# Patient Record
Sex: Female | Born: 1990 | Race: Black or African American | Hispanic: No | Marital: Single | Smoking: Current some day smoker
Health system: Southern US, Community
[De-identification: ages and names within clinical notes are randomized; demographics above are authoritative.]

## PROBLEM LIST (undated history)

## (undated) ENCOUNTER — Inpatient Hospital Stay: Payer: Self-pay

## (undated) DIAGNOSIS — K0889 Other specified disorders of teeth and supporting structures: Secondary | ICD-10-CM

## (undated) DIAGNOSIS — D649 Anemia, unspecified: Secondary | ICD-10-CM

## (undated) DIAGNOSIS — Z8742 Personal history of other diseases of the female genital tract: Secondary | ICD-10-CM

## (undated) DIAGNOSIS — Z789 Other specified health status: Secondary | ICD-10-CM

## (undated) DIAGNOSIS — R87629 Unspecified abnormal cytological findings in specimens from vagina: Secondary | ICD-10-CM

## (undated) DIAGNOSIS — J45909 Unspecified asthma, uncomplicated: Secondary | ICD-10-CM

## (undated) HISTORY — PX: NO PAST SURGERIES: SHX2092

## (undated) HISTORY — DX: Unspecified abnormal cytological findings in specimens from vagina: R87.629

## (undated) HISTORY — DX: Personal history of other diseases of the female genital tract: Z87.42

## (undated) HISTORY — DX: Other specified disorders of teeth and supporting structures: K08.89

## (undated) HISTORY — DX: Anemia, unspecified: D64.9

## (undated) HISTORY — DX: Unspecified asthma, uncomplicated: J45.909

---
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
## 2009-09-15 LAB — SICKLE CELL SCREEN: Sickle Cell Screen: NORMAL

## 2009-09-30 ENCOUNTER — Encounter: Payer: Self-pay | Admitting: Obstetrics and Gynecology

## 2009-10-19 ENCOUNTER — Encounter: Payer: Self-pay | Admitting: Pediatric Cardiology

## 2009-10-26 ENCOUNTER — Observation Stay: Payer: Self-pay | Admitting: Obstetrics and Gynecology

## 2009-10-26 ENCOUNTER — Encounter: Payer: Self-pay | Admitting: Pediatric Cardiology

## 2009-12-02 ENCOUNTER — Encounter: Payer: Self-pay | Admitting: Maternal and Fetal Medicine

## 2009-12-09 ENCOUNTER — Encounter: Payer: Self-pay | Admitting: Obstetrics and Gynecology

## 2009-12-16 ENCOUNTER — Encounter: Payer: Self-pay | Admitting: Obstetrics and Gynecology

## 2009-12-21 ENCOUNTER — Encounter: Payer: Self-pay | Admitting: Pediatric Cardiology

## 2009-12-23 ENCOUNTER — Encounter: Payer: Self-pay | Admitting: Maternal & Fetal Medicine

## 2010-01-13 ENCOUNTER — Encounter: Payer: Self-pay | Admitting: Maternal & Fetal Medicine

## 2010-01-31 ENCOUNTER — Inpatient Hospital Stay: Payer: Self-pay | Admitting: Obstetrics and Gynecology

## 2010-01-31 DIAGNOSIS — O365990 Maternal care for other known or suspected poor fetal growth, unspecified trimester, not applicable or unspecified: Secondary | ICD-10-CM

## 2012-04-02 ENCOUNTER — Emergency Department: Payer: Self-pay | Admitting: Emergency Medicine

## 2012-04-02 LAB — URINALYSIS, COMPLETE
Bacteria: NONE SEEN
Bilirubin,UR: NEGATIVE
Blood: NEGATIVE
Ketone: NEGATIVE
Ph: 6 (ref 4.5–8.0)
Specific Gravity: 1.016 (ref 1.003–1.030)
Squamous Epithelial: 1

## 2012-04-02 LAB — COMPREHENSIVE METABOLIC PANEL
Albumin: 3.3 g/dL — ABNORMAL LOW (ref 3.4–5.0)
Anion Gap: 7 (ref 7–16)
BUN: 5 mg/dL — ABNORMAL LOW (ref 7–18)
Calcium, Total: 8.4 mg/dL — ABNORMAL LOW (ref 8.5–10.1)
Chloride: 106 mmol/L (ref 98–107)
Co2: 24 mmol/L (ref 21–32)
EGFR (African American): >60
Glucose: 102 mg/dL — ABNORMAL HIGH (ref 65–99)
Potassium: 3.4 mmol/L — ABNORMAL LOW (ref 3.5–5.1)
SGPT (ALT): 12 U/L (ref 12–78)
Sodium: 137 mmol/L (ref 136–145)
Total Protein: 7.3 g/dL (ref 6.4–8.2)

## 2012-04-02 LAB — CBC
HCT: 34.7 % — ABNORMAL LOW (ref 35.0–47.0)
HGB: 11.8 g/dL — ABNORMAL LOW (ref 12.0–16.0)
MCH: 30.7 pg (ref 26.0–34.0)
MCHC: 33.9 g/dL (ref 32.0–36.0)
RBC: 3.83 10*6/uL (ref 3.80–5.20)
RDW: 13.5 % (ref 11.5–14.5)
WBC: 4.9 10*3/uL (ref 3.6–11.0)

## 2012-04-02 LAB — HCG, QUANTITATIVE, PREGNANCY: Beta Hcg, Quant.: 20266 m[IU]/mL — ABNORMAL HIGH

## 2012-04-22 ENCOUNTER — Encounter: Payer: Self-pay | Admitting: Maternal & Fetal Medicine

## 2012-05-13 ENCOUNTER — Encounter: Payer: Self-pay | Admitting: Maternal & Fetal Medicine

## 2012-07-11 ENCOUNTER — Encounter: Payer: Self-pay | Admitting: Obstetrics & Gynecology

## 2012-07-14 ENCOUNTER — Encounter: Payer: Self-pay | Admitting: Obstetrics & Gynecology

## 2012-07-16 ENCOUNTER — Encounter: Payer: Self-pay | Admitting: Pediatric Cardiology

## 2012-08-02 ENCOUNTER — Observation Stay: Payer: Self-pay | Admitting: Obstetrics and Gynecology

## 2012-08-02 LAB — URINALYSIS, COMPLETE
Bacteria: NONE SEEN
Blood: NEGATIVE
Glucose,UR: NEGATIVE mg/dL (ref 0–75)
Nitrite: NEGATIVE
Ph: 7 (ref 4.5–8.0)
RBC,UR: <1 /HPF (ref 0–5)
Specific Gravity: 1.008 (ref 1.003–1.030)

## 2012-08-02 LAB — DRUG SCREEN, URINE
Amphetamines, Ur Screen: NEGATIVE (ref ?–1000)
Barbiturates, Ur Screen: NEGATIVE (ref ?–200)
Cannabinoid 50 Ng, Ur ~~LOC~~: NEGATIVE (ref ?–50)
Cocaine Metabolite,Ur ~~LOC~~: NEGATIVE (ref ?–300)
MDMA (Ecstasy)Ur Screen: NEGATIVE (ref ?–500)
Opiate, Ur Screen: NEGATIVE (ref ?–300)

## 2012-09-02 ENCOUNTER — Inpatient Hospital Stay: Payer: Self-pay

## 2012-09-02 LAB — CBC WITH DIFFERENTIAL/PLATELET
Eosinophil #: 0.1 10*3/uL (ref 0.0–0.7)
Eosinophil %: 1.1 %
HCT: 31.7 % — ABNORMAL LOW (ref 35.0–47.0)
HGB: 10.6 g/dL — ABNORMAL LOW (ref 12.0–16.0)
Lymphocyte %: 19 %
MCH: 27.9 pg (ref 26.0–34.0)
Monocyte %: 6 %
Platelet: 219 10*3/uL (ref 150–440)
RDW: 14.3 % (ref 11.5–14.5)

## 2012-09-02 LAB — GC/CHLAMYDIA PROBE AMP

## 2012-09-03 LAB — DRUG SCREEN, URINE: Cannabinoid 50 Ng, Ur ~~LOC~~: NEGATIVE (ref ?–50)

## 2013-02-12 DIAGNOSIS — R87612 Low grade squamous intraepithelial lesion on cytologic smear of cervix (LGSIL): Secondary | ICD-10-CM | POA: Insufficient documentation

## 2013-10-23 ENCOUNTER — Encounter: Payer: Self-pay | Admitting: Maternal & Fetal Medicine

## 2013-12-11 ENCOUNTER — Encounter: Payer: Self-pay | Admitting: Maternal & Fetal Medicine

## 2014-01-13 ENCOUNTER — Encounter
Admit: 2014-01-13 | Disposition: A | Discharge status: Home / Self Care | Payer: Self-pay | Admitting: Obstetrics & Gynecology

## 2014-01-13 ENCOUNTER — Encounter
Admit: 2014-01-13 | Disposition: A | Discharge status: Home / Self Care | Payer: Self-pay | Admitting: Pediatric Cardiology

## 2014-02-09 ENCOUNTER — Encounter: Payer: Self-pay | Admitting: Obstetrics and Gynecology

## 2014-02-28 ENCOUNTER — Inpatient Hospital Stay: Payer: Self-pay | Admitting: Obstetrics & Gynecology

## 2014-02-28 LAB — CBC WITH DIFFERENTIAL/PLATELET
BASOS PCT: 0.5 %
Basophil #: 0 10*3/uL (ref 0.0–0.1)
EOS ABS: 0.3 10*3/uL (ref 0.0–0.7)
Eosinophil %: 3.5 %
HCT: 38.6 % (ref 35.0–47.0)
HGB: 13 g/dL (ref 12.0–16.0)
Lymphocyte #: 1.4 10*3/uL (ref 1.0–3.6)
Lymphocyte %: 17.1 %
MCH: 31 pg (ref 26.0–34.0)
MCHC: 33.6 g/dL (ref 32.0–36.0)
MCV: 92 fL (ref 80–100)
Monocyte #: 1 x10 3/mm — ABNORMAL HIGH (ref 0.2–0.9)
Monocyte %: 12.2 %
NEUTROS PCT: 66.7 %
Neutrophil #: 5.4 10*3/uL (ref 1.4–6.5)
Platelet: 186 10*3/uL (ref 150–440)
RBC: 4.19 10*6/uL (ref 3.80–5.20)
RDW: 13.7 % (ref 11.5–14.5)
WBC: 8.1 10*3/uL (ref 3.6–11.0)

## 2014-02-28 LAB — GC/CHLAMYDIA PROBE AMP

## 2014-03-01 LAB — HEMATOCRIT: HCT: 33.2 % — ABNORMAL LOW (ref 35.0–47.0)

## 2014-06-08 LAB — SURGICAL PATHOLOGY

## 2014-06-23 NOTE — H&P (Signed)
L&D Evaluation:  History:  HPI Late entry from 6am  23yo G3P2002 [facility] patient at 1139.2 by LMP c/w US and EDC of 03/05/14 presents with painful contractions and vaginal bleeding and found to be 6cm with a bulging bag.  Pregnancy Issues: 1. Group B positive 2. mulitple STIs this pregnancy - GC and CT, TOC 02/14/14 both negative 3. history of asthma in childhood 4. abnormal PAP -LGSIL- needs colpo pp per report 5. smoker - quit in pregnancy 6. etoh use in pregnancy 7. FOB drinks/uses drugs per report. 8. history of depression 9. G1 complicated by IUGR 10.  There are notes in the patient's [facility] chart referring to a fetal ECHO.  The patient does not know why they had recommended this, and one was not completed.  Per patient, at the anatomy scan she was told that everything was "fine." There is no documentation of necessity of this study.   O+/ Ab neg / VI / RI / HBsAg neg / RPR NR / HIV NR /   Presents with contractions   Patient's Medical History Asthma  in childhood, depression, anemia   Patient's Surgical History none   Medications Pre Natal Vitamins   Allergies NKDA   Social History tobacco  EtOH   Family History depression, HTN, DM, hepatitis   ROS:  ROS All systems were reviewed.  HEENT, CNS, GI, GU, Respiratory, CV, Renal and Musculoskeletal systems were found to be normal.   Exam:  Vital Signs stable   General no apparent distress   Mental Status clear   Chest clear   Heart normal sinus rhythm   Abdomen gravid, non-tender   Estimated Fetal Weight Average for gestational age   Back no CVAT   Reflexes 2+   Clonus negative   Pelvic 6/90/ballotable with bulging bag   Mebranes Intact   FHT normal rate with no decels   Fetal Heart Rate 135   Ucx regular, q2-5 min   Skin dry, no lesions   Impression:  Impression active labor   Plan:  Comments 23yo G3P2002 at 39.2 in labor  1. labor -admit to L&D -GBS positive, start PCN 5million units bolus,  then 2.135million units q4hrs -await SROM as fetus is ballotable -epidural PRN -IVF LR @ 125 -clear diet -GCCT pcr testing on admission  2.Dispo -anticpate SVD with routine recovery -desires Nexplanon PP for contraception -PP care at [facility] -needs colpo PP   Electronic Signatures: Carolyn Petersen, Elenora Fenderhelsea C (MD)  (Signed 16-Jan-16 08:37)  Authored: L&D Evaluation   Last Updated: 16-Jan-16 08:37 by Carolyn Petersen, Elenora Fenderhelsea C (MD)

## 2014-06-23 NOTE — H&P (Signed)
L&D Evaluation:  History Expanded:  HPI 24 yo G2 P1, EDD of 09/10/12 per LMP and 14 week US, presents at 1733w6d with c/o regular contractions. Denies SROM and VB, +FM. Pt monitored for several hours with intermittant ambulation, cervix changed from 4 to 5 cm. PNC at [facility] notable for early entry to care, early MJ, tobacco and ETOH use, anemia, h/o DV, + Chlamydia tx 08/21/12 - no TOC yet. Prior SVD 01/31/10- IUGR (5#8oz)   Blood Type (Maternal) O positive   Group B Strep Results Maternal (Result >5wks must be treated as unknown) positive   Maternal HIV Negative   Maternal Syphilis Ab Nonreactive   Maternal Varicella Immune   Rubella Results (Maternal) immune   Maternal T-Dap Immune   Patient's Medical History Asthma   Patient's Surgical History none   Medications Pre Natal Vitamins   Allergies NKDA   Social History prior use tobacco, MJ   ROS:  ROS see HPI   Exam:  Vital Signs stable   General no apparent distress   Mental Status clear   Chest clear   Heart no murmur/gallop/rubs   Abdomen gravid, tender with contractions   Estimated Fetal Weight Small for gestational age, 6 lbs   Fetal Position vertex   Pelvic no external lesions, 5/75/-2   Mebranes Intact   FHT normal rate with no decels, category 1 tracing   Ucx regular   Impression:  Impression active labor   Plan:  Plan antibiotics for GBBS prophylaxis   Comments Pt desires to ambulate again, will allow ambulation after IV/labs drawn/antibiotics started. May have pain medication or epidural when desired.  UDS Urine Aptima   Electronic Signatures: Vella KohlerBrothers, Lydian Chavous K (CNM)  (Signed 21-Jul-14 20:15)  Authored: L&D Evaluation   Last Updated: 21-Jul-14 20:15 by Vella KohlerBrothers, Melesa Lecy K (CNM)

## 2015-02-14 NOTE — L&D Delivery Note (Signed)
Delivery Note At 3:31 AM a viable and female  was delivered via Vaginal, Spontaneous Delivery (Presentation: ;  ).  APGAR8/9: , ; weight  .   Placenta status intact: , .  Cord: 3 v delayed clamping   with the following complications:None .  Cord pH: not done  Uncomplicated SVD . AROM at +3 station . Vigorous female  Anesthesia:CLE   Episiotomy: none  Lacerations: none   Suture Repair: n/a Est. Blood Loss (mL):  200 cc  Mom to postpartum.  Baby to Couplet care / Skin to Skin.  Marian Meneely 12/01/2015, 3:41 AM

## 2015-06-23 ENCOUNTER — Encounter: Payer: Self-pay | Admitting: *Deleted

## 2015-06-23 ENCOUNTER — Emergency Department
Admission: EM | Admit: 2015-06-23 | Discharge: 2015-06-23 | Disposition: A | Discharge status: Home / Self Care | Payer: Medicaid Other | Attending: Emergency Medicine | Admitting: Emergency Medicine

## 2015-06-23 ENCOUNTER — Emergency Department: Payer: Medicaid Other

## 2015-06-23 DIAGNOSIS — R109 Unspecified abdominal pain: Secondary | ICD-10-CM

## 2015-06-23 DIAGNOSIS — O26892 Other specified pregnancy related conditions, second trimester: Secondary | ICD-10-CM | POA: Insufficient documentation

## 2015-06-23 DIAGNOSIS — M545 Low back pain: Secondary | ICD-10-CM | POA: Diagnosis present

## 2015-06-23 DIAGNOSIS — Z3A16 16 weeks gestation of pregnancy: Secondary | ICD-10-CM | POA: Insufficient documentation

## 2015-06-23 DIAGNOSIS — O26899 Other specified pregnancy related conditions, unspecified trimester: Secondary | ICD-10-CM

## 2015-06-23 DIAGNOSIS — R1030 Lower abdominal pain, unspecified: Secondary | ICD-10-CM | POA: Insufficient documentation

## 2015-06-23 LAB — COMPREHENSIVE METABOLIC PANEL
ALT: 9 U/L — AB (ref 14–54)
AST: 16 U/L (ref 15–41)
Albumin: 4.1 g/dL (ref 3.5–5.0)
Alkaline Phosphatase: 56 U/L (ref 38–126)
Anion gap: 7 (ref 5–15)
CHLORIDE: 105 mmol/L (ref 101–111)
CO2: 23 mmol/L (ref 22–32)
Calcium: 9.1 mg/dL (ref 8.9–10.3)
Creatinine, Ser: 0.56 mg/dL (ref 0.44–1.00)
GFR calc Af Amer: >60 mL/min (ref >60)
GFR calc non Af Amer: >60 mL/min (ref >60)
GLUCOSE: 86 mg/dL (ref 65–99)
POTASSIUM: 3.5 mmol/L (ref 3.5–5.1)
Sodium: 135 mmol/L (ref 135–145)
Total Bilirubin: 0.3 mg/dL (ref 0.3–1.2)
Total Protein: 7.4 g/dL (ref 6.5–8.1)

## 2015-06-23 LAB — CBC
HEMATOCRIT: 38.1 % (ref 35.0–47.0)
Hemoglobin: 12.9 g/dL (ref 12.0–16.0)
MCH: 30.7 pg (ref 26.0–34.0)
MCHC: 34 g/dL (ref 32.0–36.0)
MCV: 90.4 fL (ref 80.0–100.0)
Platelets: 218 10*3/uL (ref 150–440)
RBC: 4.22 MIL/uL (ref 3.80–5.20)
RDW: 13.7 % (ref 11.5–14.5)
WBC: 6.3 10*3/uL (ref 3.6–11.0)

## 2015-06-23 LAB — URINALYSIS COMPLETE WITH MICROSCOPIC (ARMC ONLY)
BACTERIA UA: NONE SEEN
BILIRUBIN URINE: NEGATIVE
GLUCOSE, UA: NEGATIVE mg/dL
Hgb urine dipstick: NEGATIVE
Ketones, ur: NEGATIVE mg/dL
LEUKOCYTES UA: NEGATIVE
Nitrite: NEGATIVE
PH: 7 (ref 5.0–8.0)
Protein, ur: NEGATIVE mg/dL
Specific Gravity, Urine: 1.003 — ABNORMAL LOW (ref 1.005–1.030)

## 2015-06-23 LAB — HCG, QUANTITATIVE, PREGNANCY: hCG, Beta Chain, Quant, S: 11688 m[IU]/mL — ABNORMAL HIGH (ref <5)

## 2015-06-23 MED ORDER — CYCLOBENZAPRINE HCL 10 MG PO TABS: 10.0000 mg | ORAL_TABLET | Freq: Three times a day (TID) | ORAL | Status: DC | PRN

## 2015-06-23 NOTE — Discharge Instructions (Signed)

## 2015-06-23 NOTE — ED Notes (Signed)
Pt provided water to drink, ok per Dr. Mayford KnifeWilliams.

## 2015-06-23 NOTE — ED Provider Notes (Signed)
[facility] Emergency Department Provider Note        Time seen: ----------------------------------------- 7:48 PM on 06/23/2015 -----------------------------------------    I have reviewed the triage vital signs and the nursing notes.   HISTORY  Chief Complaint Abdominal Pain    HPI Carolyn Petersen is a 25 y.o. female who states she is around [redacted] weeks pregnant and is having lower abdominal pain and low back pain. Symptoms began yesterday. She is G4 P3, no vaginal bleeding or discharge. States she has some diarrhea that started this morning. [facility] Nothing makes her pain better or worse.   No past medical history on file.  There are no active problems to display for this patient.   No past surgical history on file.  Allergies Review of patient's allergies indicates no known allergies.  Social History Social History  Substance Use Topics  . Smoking status: Never Smoker   . Smokeless tobacco: None  . Alcohol Use: No    Review of Systems Constitutional: Negative for fever. Eyes: Negative for visual changes. ENT: Negative for sore throat. Cardiovascular: Negative for chest pain. Respiratory: Negative for shortness of breath. Gastrointestinal: Positive for abdominal pain Genitourinary: Negative for dysuria. Musculoskeletal: Positive for low back pain Skin: Negative for rash. Neurological: Negative for headaches, focal weakness or numbness.  10-point ROS otherwise negative.  ____________________________________________   PHYSICAL EXAM:  VITAL SIGNS: ED Triage Vitals  Enc Vitals Group     BP 06/23/15 1709 131/82 mmHg     Pulse Rate 06/23/15 1709 77     Resp 06/23/15 1709 20     Temp 06/23/15 1709 98.6 F (37 C)     Temp Source 06/23/15 1709 Oral     SpO2 06/23/15 1709 100 %     Weight 06/23/15 1709 137 lb (62.143 kg)     Height 06/23/15 1709 5\' 2"  (1.575 m)     Head Cir --      Peak Flow --      Pain Score 06/23/15 1710 6     Pain Loc --      Pain Edu? --      Excl. in GC? --     Constitutional: Alert and oriented. Well appearing and in no distress. Eyes: Conjunctivae are normal. PERRL. Normal extraocular movements. ENT   Head: Normocephalic and atraumatic.   Nose: No congestion/rhinnorhea.   Mouth/Throat: Mucous membranes are moist.   Neck: No stridor. Cardiovascular: Normal rate, regular rhythm. No murmurs, rubs, or gallops. Respiratory: Normal respiratory effort without tachypnea nor retractions. Breath sounds are clear and equal bilaterally. No wheezes/rales/rhonchi. Gastrointestinal: Gravid uterus, nonfocal tenderness. Normal bowel sounds. Musculoskeletal: Nontender with normal range of motion in all extremities. No lower extremity tenderness nor edema. Neurologic:  Normal speech and language. No gross focal neurologic deficits are appreciated.  Skin:  Skin is warm, dry and intact. No rash noted. Psychiatric: Mood and affect are normal. Speech and behavior are normal.  ____________________________________________  ED COURSE:  Pertinent labs & imaging results that were available during my care of the patient were reviewed by me and considered in my medical decision making (see chart for details). Patient with diffuse abdominal pain and pregnancy, will check basic labs and imaging. ____________________________________________    LABS (pertinent positives/negatives)  Labs Reviewed  COMPREHENSIVE METABOLIC PANEL - Abnormal; Notable for the following:    BUN <5 (*)    ALT 9 (*)    All other components within normal limits  URINALYSIS COMPLETEWITH  MICROSCOPIC ([facility] ONLY) - Abnormal; Notable for the following:    Color, Urine STRAW (*)    APPearance CLEAR (*)    Specific Gravity, Urine 1.003 (*)    Squamous Epithelial / LPF 6-30 (*)    All other components within normal limits  HCG, QUANTITATIVE, PREGNANCY - Abnormal; Notable for the following:    hCG,  Beta Chain, Quant, [place] [address] (*)    All other components within normal limits  CBC    RADIOLOGY  Pregnancy ultrasound IMPRESSION: 1. Single intrauterine fetus with normal heart rate and subjectively normal amniotic fluid volume. 2. Estimated gestational age [redacted] weeks 1 day  This exam is performed on an emergent basis and does not comprehensively evaluate fetal size, dating, or anatomy; follow-up complete OB US should be considered if further fetal assessment is warranted.  ____________________________________________  FINAL ASSESSMENT AND PLAN  Abdominal pain and pregnancy  Plan: Patient with labs and imaging as dictated above. No clear etiology for her symptoms. I will advise Tylenol and Flexeril to take as needed. She stable for outpatient follow-up with her GYN doctor.   Emily Filbert, MD   Note: This dictation was prepared with Dragon dictation. Any transcriptional errors that result from this process are unintentional   Emily Filbert, MD 06/23/15 2022

## 2015-06-23 NOTE — ED Notes (Signed)
Pt is approx  [redacted] weeks pregnant.  Pt now has low abd pain.  Pt reports low back pain   No vag bleeding  No dysuria.  Pt alert.

## 2015-06-23 NOTE — ED Notes (Addendum)
Pt states lower back pain, lower abdominal pain that began yesterday. Pt states she had 1 US when she first found out she was pregnant, states she is about 4 months along. Denies vaginal bleeding or discharge. Denies fever, N&V. States some diarrhea that began this morning. Pt sees [facility] for OB care.

## 2015-09-02 ENCOUNTER — Observation Stay
Admission: EM | Admit: 2015-09-02 | Discharge: 2015-09-02 | Disposition: A | Discharge status: Home / Self Care | Payer: Medicaid Other | Attending: Obstetrics and Gynecology | Admitting: Obstetrics and Gynecology

## 2015-09-02 DIAGNOSIS — O9989 Other specified diseases and conditions complicating pregnancy, childbirth and the puerperium: Secondary | ICD-10-CM | POA: Diagnosis not present

## 2015-09-02 DIAGNOSIS — O99342 Other mental disorders complicating pregnancy, second trimester: Secondary | ICD-10-CM | POA: Insufficient documentation

## 2015-09-02 DIAGNOSIS — R1030 Lower abdominal pain, unspecified: Secondary | ICD-10-CM | POA: Diagnosis not present

## 2015-09-02 DIAGNOSIS — F329 Major depressive disorder, single episode, unspecified: Secondary | ICD-10-CM | POA: Insufficient documentation

## 2015-09-02 DIAGNOSIS — Z3A26 26 weeks gestation of pregnancy: Secondary | ICD-10-CM | POA: Diagnosis not present

## 2015-09-02 LAB — URINALYSIS COMPLETE WITH MICROSCOPIC (ARMC ONLY)
Bilirubin Urine: NEGATIVE
Glucose, UA: NEGATIVE mg/dL
HGB URINE DIPSTICK: NEGATIVE
Ketones, ur: NEGATIVE mg/dL
LEUKOCYTES UA: NEGATIVE
NITRITE: NEGATIVE
PH: 6 (ref 5.0–8.0)
PROTEIN: NEGATIVE mg/dL
SPECIFIC GRAVITY, URINE: 1.011 (ref 1.005–1.030)

## 2015-09-02 LAB — FETAL FIBRONECTIN: FETAL FIBRONECTIN: NEGATIVE

## 2015-09-02 MED ORDER — TERBUTALINE SULFATE 1 MG/ML IJ SOLN
INTRAMUSCULAR | Status: AC
  Administered 2015-09-02: 0.25 mg via SUBCUTANEOUS
  Filled 2015-09-02: qty 1

## 2015-09-02 MED ORDER — TERBUTALINE SULFATE 1 MG/ML IJ SOLN
0.2500 mg | Freq: Once | INTRAMUSCULAR | Status: AC
  Administered 2015-09-02: 0.25 mg via SUBCUTANEOUS

## 2015-09-02 NOTE — OB Triage Note (Signed)
Pt discharging walking to car with FOB and child with belongings and written instructions. Instructions reviewed with pt, pt voicing understanding with no further questions

## 2015-09-02 NOTE — OB Triage Note (Signed)
[name] here with lower abdominal pain that began "late last night", cannot describe how often pain occurs, rates pain 6/10, and states the pain lasts about 3 min when it occurs. Last episode of pain approx 10 min ago . Has not contacted [facility] regarding this pain. Denies bleeding, LOF, denies any S/S of urinary discomfort.  Reports +FM. Patient given marker button to press when she feels pain.

## 2015-09-02 NOTE — Discharge Summary (Signed)
  Author: Suzy Bouchardhomas J Schermerhorn, MD Service: Obstetrics Author Type: Physician    Filed: 09/02/2015 8:09 PM Note Time: 09/02/2015 8:01 PM Status: Signed   Editor: Suzy Bouchardhomas J Schermerhorn, MD (Physician)     Expand All Collapse All   Patient ID: Carolyn Petersen Meikle, female DOB: 10-29-1990, 25 y.o. MRN: [id number] Carolyn Petersen Pollino 10-29-1990 G4 P3 8446w3d presents for Lower adb pain left > right since 12 am this am . Pain come on after sexual intercourse . Some nausea . No emesis Or fever . BM are nl .  noLOF , no vaginal bleeding , PMHX: dysplasia  Depression  PSHX : none POBX 3 SVD alergies NKDA  Social no tobacco , etoh , no illicit drugs  O;Temp(Src) 97.9 F (36.6 C) (Oral)  Resp 20  Ht 5\' 2"  (1.575 m)  Wt 138 lb (62.596 kg)  BMI 25.23 kg/m2  LMP 03/01/2015 (Approximate) ABDsoft , nondistended . No rebound   CX int os closed by RN NSTreactive , no decels Rare CTX initially --> dissipated after sq terb  Labs: ua neg A: Abdominal pain with benign exam . Pain may be related to sexual activity and ctx . No PTL by exam  Reassuring fetal monitoring P:d/c hom ewith precautions  Pelvic rest  F/up [facility] / L+D if symptoms persist or worsen  Cont daily Fetal movt

## 2015-09-02 NOTE — Progress Notes (Signed)
Patient ID: Carolyn Petersen, female   DOB: 01/04/1991, 25 y.o.   MRN: [id number] Carolyn Petersen 01/04/1991 G4 P3 968w3d presents for  Lower adb pain left > right since 12 am this am . Pain come on after sexual intercourse . Some nausea . No emesis  Or fever . BM are nl .  noLOF , no vaginal bleeding , PMHX: dysplasia  Depression  PSHX : none POBX 3 SVD alergies NKDA  Social no tobacco , etoh , no illicit drugs  O;Temp(Src) 97.9 F (36.6 C) (Oral)  Resp 20  Ht 5\' 2"  (1.575 m)  Wt 138 lb (62.596 kg)  BMI 25.23 kg/m2  LMP 03/01/2015 (Approximate) ABDsoft , nondistended . No rebound   CX int os closed by RN NSTreactive , no decels  Rare CTX initially --> dissipated after sq terb  Labs: ua neg A: Abdominal pain with benign exam . Pain may be related to sexual activity and ctx . No PTL by exam  Reassuring fetal monitoring P:d/c hom ewith precautions  Pelvic rest  F/up [facility] / L+D if symptoms persist or worsen  Cont daily Fetal movt

## 2015-09-02 NOTE — Discharge Instructions (Signed)
Note pt instructed as per Dr Feliberto GottronSchermerhorn- to [facility] if further concerns develop. Also to keep her next Health Dept appointment on the 31st. Pt also told to refrain from sexual activity, and breast stimulation.

## 2015-09-03 LAB — URINE CULTURE: Culture: 9000 — AB

## 2015-10-14 ENCOUNTER — Emergency Department
Admission: EM | Admit: 2015-10-14 | Discharge: 2015-10-14 | Disposition: A | Discharge status: Home / Self Care | Payer: Medicaid Other | Attending: Emergency Medicine | Admitting: Emergency Medicine

## 2015-10-14 ENCOUNTER — Encounter: Payer: Self-pay | Admitting: Emergency Medicine

## 2015-10-14 ENCOUNTER — Emergency Department: Payer: Medicaid Other

## 2015-10-14 DIAGNOSIS — R0789 Other chest pain: Secondary | ICD-10-CM | POA: Insufficient documentation

## 2015-10-14 DIAGNOSIS — K219 Gastro-esophageal reflux disease without esophagitis: Secondary | ICD-10-CM | POA: Diagnosis not present

## 2015-10-14 DIAGNOSIS — R0602 Shortness of breath: Secondary | ICD-10-CM | POA: Insufficient documentation

## 2015-10-14 DIAGNOSIS — O99613 Diseases of the digestive system complicating pregnancy, third trimester: Secondary | ICD-10-CM | POA: Diagnosis not present

## 2015-10-14 DIAGNOSIS — Z3A32 32 weeks gestation of pregnancy: Secondary | ICD-10-CM | POA: Diagnosis not present

## 2015-10-14 DIAGNOSIS — O99513 Diseases of the respiratory system complicating pregnancy, third trimester: Secondary | ICD-10-CM | POA: Insufficient documentation

## 2015-10-14 DIAGNOSIS — K2960 Other gastritis without bleeding: Secondary | ICD-10-CM

## 2015-10-14 LAB — URINALYSIS COMPLETE WITH MICROSCOPIC (ARMC ONLY)
BACTERIA UA: NONE SEEN
Bilirubin Urine: NEGATIVE
Glucose, UA: NEGATIVE mg/dL
HGB URINE DIPSTICK: NEGATIVE
Ketones, ur: NEGATIVE mg/dL
LEUKOCYTES UA: NEGATIVE
NITRITE: NEGATIVE
PH: 6 (ref 5.0–8.0)
PROTEIN: NEGATIVE mg/dL
RBC / HPF: NONE SEEN RBC/hpf (ref 0–5)
Specific Gravity, Urine: 1.009 (ref 1.005–1.030)

## 2015-10-14 LAB — COMPREHENSIVE METABOLIC PANEL
ALBUMIN: 3.2 g/dL — AB (ref 3.5–5.0)
ALT: 9 U/L — ABNORMAL LOW (ref 14–54)
ANION GAP: 6 (ref 5–15)
AST: 22 U/L (ref 15–41)
Alkaline Phosphatase: 54 U/L (ref 38–126)
BUN: 6 mg/dL (ref 6–20)
CHLORIDE: 108 mmol/L (ref 101–111)
CO2: 21 mmol/L — AB (ref 22–32)
Calcium: 8.3 mg/dL — ABNORMAL LOW (ref 8.9–10.3)
Creatinine, Ser: 0.65 mg/dL (ref 0.44–1.00)
GFR calc Af Amer: >60 mL/min (ref >60)
GFR calc non Af Amer: >60 mL/min (ref >60)
GLUCOSE: 83 mg/dL (ref 65–99)
POTASSIUM: 3.1 mmol/L — AB (ref 3.5–5.1)
SODIUM: 135 mmol/L (ref 135–145)
Total Bilirubin: 0.4 mg/dL (ref 0.3–1.2)
Total Protein: 6.4 g/dL — ABNORMAL LOW (ref 6.5–8.1)

## 2015-10-14 LAB — CBC WITH DIFFERENTIAL/PLATELET
BASOS PCT: 1 %
Basophils Absolute: 0 10*3/uL (ref 0–0.1)
EOS ABS: 0.5 10*3/uL (ref 0–0.7)
EOS PCT: 8 %
HCT: 33.4 % — ABNORMAL LOW (ref 35.0–47.0)
HEMOGLOBIN: 12 g/dL (ref 12.0–16.0)
Lymphocytes Relative: 28 %
Lymphs Abs: 1.8 10*3/uL (ref 1.0–3.6)
MCH: 32.2 pg (ref 26.0–34.0)
MCHC: 36 g/dL (ref 32.0–36.0)
MCV: 89.5 fL (ref 80.0–100.0)
MONO ABS: 0.6 10*3/uL (ref 0.2–0.9)
MONOS PCT: 10 %
NEUTROS PCT: 55 %
Neutro Abs: 3.6 10*3/uL (ref 1.4–6.5)
PLATELETS: 162 10*3/uL (ref 150–440)
RBC: 3.73 MIL/uL — ABNORMAL LOW (ref 3.80–5.20)
RDW: 13.5 % (ref 11.5–14.5)
WBC: 6.5 10*3/uL (ref 3.6–11.0)

## 2015-10-14 LAB — FIBRIN DERIVATIVES D-DIMER (ARMC ONLY): Fibrin derivatives D-dimer (ARMC): 743 — ABNORMAL HIGH (ref 0–499)

## 2015-10-14 MED ORDER — RANITIDINE HCL 150 MG PO TABS: 150.0000 mg | ORAL_TABLET | Freq: Every day | ORAL | 1 refills | Status: DC

## 2015-10-14 MED ORDER — IOPAMIDOL (ISOVUE-370) INJECTION 76%
100.0000 mL | Freq: Once | INTRAVENOUS | Status: AC | PRN
  Administered 2015-10-14: 100 mL via INTRAVENOUS

## 2015-10-14 NOTE — ED Provider Notes (Signed)
Time Seen: Approximately 0428  I have reviewed the triage notes  Chief Complaint: Chest Pain and Shortness of Breath   History of Present Illness: Carolyn Petersen is a 25 y.o. female who states that she's had some occasional left-sided chest discomfort and shortness of breath. Patient states that she spit up some bloody sputum this evening. She states she's had this now for the past week. Seemed to be worse today. Denies any melena or hematochezia. She is currently [redacted] weeks pregnant gravida 4 para 3 with no previous complications with her deliveries. She denies any leg pain or swelling or history of pulmonary embolism or deep venous thrombosis. Denies any current chest or abdominal pain.   History reviewed. No pertinent past medical history.  Patient Active Problem List   Diagnosis Date Noted  . Labor and delivery, indication for care 09/02/2015    History reviewed. No pertinent surgical history.  History reviewed. No pertinent surgical history.  Current Outpatient Rx  . Order #: [id number] Class: Print    Allergies:  Review of patient's allergies indicates no known allergies.  Family History: History reviewed. No pertinent family history.  Social History: Social History  Substance Use Topics  . Smoking status: Never Smoker  . Smokeless tobacco: Never Used  . Alcohol use No     Review of Systems:   10 point review of systems was performed and was otherwise negative:  Constitutional: No fever Eyes: No visual disturbances ENT: She states she's had occasional sore throat without any ear pain Cardiac: No current chest pain Respiratory: Mild shortness of breath, wheezing, or stridor Abdomen: No abdominal pain, no vomiting, No diarrhea Endocrine: No weight loss, No night sweats Extremities: No peripheral edema, cyanosis Skin: No rashes, easy bruising Neurologic: No focal weakness, trouble with speech or swollowing Urologic: No dysuria, Hematuria, or urinary  frequency   Physical Exam:  ED Triage Vitals  Enc Vitals Group     BP 10/14/15 0434 117/70     Pulse Rate 10/14/15 0432 72     Resp 10/14/15 0432 12     Temp 10/14/15 0432 98.2 F (36.8 C)     Temp Source 10/14/15 0432 Oral     SpO2 10/14/15 0428 100 %     Weight 10/14/15 0435 148 lb (67.1 kg)     Height 10/14/15 0435 5\' 2"  (1.575 m)     Head Circumference --      Peak Flow --      Pain Score 10/14/15 0434 5     Pain Loc --      Pain Edu? --      Excl. in GC? --     General: Awake , Alert , and Oriented times 3; GCS 15 Head: Normal cephalic , atraumatic Eyes: Pupils equal , round, reactive to light Nose/Throat: No nasal drainage, patent upper airway without erythema or exudate. No visible bleeding in the nasal or oral cavity Neck: Supple, Full range of motion, No anterior adenopathy or palpable thyroid masses Lungs: Clear to ascultation without wheezes , rhonchi, or rales Heart: Regular rate, regular rhythm without murmurs , gallops , or rubs Abdomen: Thayer OhmLeopold maneuvers show palpable uterus above the umbilicus approximately 10 cm, no rebound, guarding , or rigidity; bowel sounds positive and symmetric in all 4 quadrants. No organomegaly .        Extremities: 2 plus symmetric pulses. No edema, clubbing or cyanosis Neurologic: normal ambulation, Motor symmetric without deficits, sensory intact Skin: warm, dry, no rashes   Labs:  All laboratory work was reviewed including any pertinent negatives or positives listed below:  Labs Reviewed  CBC WITH DIFFERENTIAL/PLATELET  COMPREHENSIVE METABOLIC PANEL  URINALYSIS COMPLETEWITH MICROSCOPIC ([facility] ONLY)  FIBRIN DERIVATIVES D-DIMER ([facility] ONLY)  Review laboratory work shows an elevated D-dimer test   Radiology:  "Ct Angio Chest Pe W Or Wo Contrast  Result Date: 10/14/2015 CLINICAL DATA:  Left-sided chest pain, shortness of breath, bloody sputum. Thirty-two weeks pregnant. EXAM: CT ANGIOGRAPHY CHEST WITH CONTRAST TECHNIQUE:  Multidetector CT imaging of the chest was performed using the standard protocol during bolus administration of intravenous contrast. Multiplanar CT image reconstructions and MIPs were obtained to evaluate the vascular anatomy. CONTRAST:  100 mL Isovue 370 IV COMPARISON:  Chest CT 06/30/2007 FINDINGS: Vascular: No pulmonary embolus. The main pulmonary artery is within normal limits for size. No CT evidence of acute right heart strain. Visualized portion of the aorta is normal. Heart size is normal. Mediastinum/Nodes: No mediastinal or axillary adenopathy. Lungs: No pulmonary nodules or masses. No pleural effusion or focal consolidation. Visualized abdomen: Contrast bolus timing is not optimized for evaluation of the abdominal organs. Within this limitation, the visualized organs of the upper abdomen are normal. Musculoskeletal: No lytic or blastic osseous lesions. The visualized extrathoracic soft tissues are normal. Review of the MIP images confirms the above findings. IMPRESSION: 1. No pulmonary embolus. 2. Normal chest CT. Electronically Signed   By: Deatra Robinson M.D.   On: 10/14/2015 06:23  " * I personally reviewed the radiologic studies     ED Course: * Given the patient's current clinical presentation and objective findings appears that her blood tinged sputum was unlikely to be from a pulmonary source and I felt it may be esophageal reflux disease given that she's in the third trimester of her pregnancy. She does feel fetal movements and fetal heart tones were performed which were normal. He does not appear to have any pulmonary embolism, community-acquired pneumonia, pregnancy related pulmonary edema, etc. CT was performed after we discussed risk factors and alternatives etc. She agreed to the CAT scan after discussion with the radiology tech at the bedside about the radiation exposure and risk.   Clinical Course     Assessment:  Hemoptysis Possible esophageal reflux  disease      Plan:  Outpatient " New Prescriptions   RANITIDINE (ZANTAC) 150 MG TABLET    Take 1 tablet (150 mg total) by mouth at bedtime.  " Patient was advised to return immediately if condition worsens. Patient was advised to follow up with their primary care physician or other specialized physicians involved in their outpatient care. The patient and/or family member/power of attorney had laboratory results reviewed at the bedside. All questions and concerns were addressed and appropriate discharge instructions were distributed by the nursing staff.             Jennye Moccasin, MD 10/14/15 [phone]

## 2015-10-14 NOTE — ED Triage Notes (Signed)
Pt arrived from home by EMS with c/o left sided chest pain, SOB and bloody sputum without cough, x1 week. Pt is [redacted] week pregnant, denies abdominal pain.

## 2015-11-19 ENCOUNTER — Observation Stay
Admission: EM | Admit: 2015-11-19 | Discharge: 2015-11-19 | Disposition: A | Discharge status: Home / Self Care | Payer: Medicaid Other | Attending: Obstetrics and Gynecology | Admitting: Obstetrics and Gynecology

## 2015-11-19 DIAGNOSIS — O365990 Maternal care for other known or suspected poor fetal growth, unspecified trimester, not applicable or unspecified: Secondary | ICD-10-CM | POA: Diagnosis present

## 2015-11-19 DIAGNOSIS — Z3A37 37 weeks gestation of pregnancy: Secondary | ICD-10-CM | POA: Insufficient documentation

## 2015-11-19 DIAGNOSIS — O288 Other abnormal findings on antenatal screening of mother: Secondary | ICD-10-CM | POA: Diagnosis present

## 2015-11-19 DIAGNOSIS — O365930 Maternal care for other known or suspected poor fetal growth, third trimester, not applicable or unspecified: Principal | ICD-10-CM | POA: Insufficient documentation

## 2015-11-19 MED ORDER — ACETAMINOPHEN 325 MG PO TABS: 650.0000 mg | ORAL_TABLET | ORAL | Status: DC | PRN

## 2015-11-19 NOTE — Progress Notes (Signed)
Dr schermerhorn here to visit patient and discuss plan of care with pt.

## 2015-11-19 NOTE — Plan of Care (Signed)
Pt d/c home with d/c instructions after being seen by dr schermerhorn and monitor strip reviewed. Pt to come to [facility] for biweekly nst until she delivers.

## 2015-11-19 NOTE — Discharge Summary (Signed)
  Carolyn CromerJalesia Petersen 1990-12-22 G4 P3 2252w4d presents for low birth weight on U/S today at [facility] . EFW 17%, normal AfInoLOF , no vaginal bleeding , PMHX : depression LGSIL , asthma  POBx low birth weight infant LMP 03/01/2015 (Approximate)   O; Lungs CTA CV RRR  ABDsoft NT  CX 1cm / 30 OOP  NSTreactive , regular ctx  Labs: none A: ctx and no labor  SGA with normal NST  P: daily kick counts  [facility] has been notified  2x/ week NST

## 2015-11-19 NOTE — Plan of Care (Signed)
Pt states she was over at the dr's office and received an ultrasound that she states they said the baby was small for gestational age. Then was sent to l/d for NST. EFM applied and instructions given to pt.

## 2015-11-24 ENCOUNTER — Inpatient Hospital Stay
Admission: RE | Admit: 2015-11-24 | Discharge: 2015-11-24 | Disposition: A | Discharge status: Home / Self Care | Payer: Medicaid Other | Attending: Obstetrics and Gynecology | Admitting: Obstetrics and Gynecology

## 2015-11-24 DIAGNOSIS — Z3483 Encounter for supervision of other normal pregnancy, third trimester: Secondary | ICD-10-CM | POA: Insufficient documentation

## 2015-11-24 DIAGNOSIS — Z3A38 38 weeks gestation of pregnancy: Secondary | ICD-10-CM | POA: Insufficient documentation

## 2015-11-24 NOTE — Progress Notes (Signed)
1. Admit for Observation to [facility]. 2. Non-stress test 3. Call MD/CNM with results. 4. For non-reassuring strip, use resuscitative measures (turning, O2, start IV and call on call STAT).

## 2015-11-24 NOTE — H&P (Signed)
ANTEPARTUM ADMISSION HISTORY AND PHYSICAL NOTE   History of Present Illness: Carolyn Petersen is a [age] y.o. G4P3003 at 4371w2d for NST today at [facility]. LMP was 03/01/15 with EDD of 12/06/15. PNC at [facility] significant for Past hx of low birth weight in previous infant.    Patient Active Problem List   Diagnosis Date Noted  . NST (non-stress test) nonreactive 11/19/2015  . Pregnancy affected by fetal growth restriction 11/19/2015  . 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  OB History  Gravida Para Term Preterm AB Living  4 3 3     3   SAB TAB Ectopic Multiple Live Births          3    # Outcome Date GA Lbr Len/2nd Weight Sex Delivery Anes PTL Lv  4 Current           3 Term 02/28/14        LIV  2 Term 09/03/12        LIV  1 Term 01/31/10        LIV      Social History   Social History  . Marital status: Single    Spouse name: N/A  . Number of children: N/A  . Years of education: N/A   Social History Main Topics  . Smoking status: Never Smoker  . Smokeless tobacco: Never Used  . Alcohol use No  . Drug use: Unknown  . Sexual activity: Not on file   Other Topics Concern  . Not on file   Social History Narrative  . No narrative on file    No family history on file.  No Known Allergies  Prescriptions Prior to Admission  Medication Sig Dispense Refill Last Dose  . cyclobenzaprine (FLEXERIL) 10 MG tablet Take 1 tablet (10 mg total) by mouth every 8 (eight) hours as needed for muscle spasms. 30 tablet 1   . ranitidine (ZANTAC) 150 MG tablet Take 1 tablet (150 mg total) by mouth at bedtime. 30 tablet 1     Review of Systems - Benign x 9  Vitals:  BP 120/70 (BP Location: Left Arm)   Pulse (!) 112   Temp 97.6 F (36.4 C) (Oral)   LMP 03/01/2015 (Approximate)  Physical Examination:  General appearance: alert, cooperative and no  distress Abdomen: soft, non-tender; bowel sounds normal; no masses, no organomegaly Pelvic: cervix normal in appearance, external genitalia normal, no adnexal masses or tenderness, no bladder tenderness, no cervical motion tenderness, rectovaginal septum normal, urethra without abnormality  Extremities: extremities normal, atraumatic, no cyanosis or edema  Membranes:Intact Fetal Monitoring: reactive NST with 2 accels 15 x 15 BPM Tocometer: None  EFM: Reactive NST with 2 accels 15 x 15 BPM   Labs: VI, RI, Sickle normal, HepB neg, GC/CH neg, HIV NR, O pos  Imaging Studies: US 11/19/15 showed EFW of 17%, normal AFI,   Assessment and Plan:  A/P: 25yo G1P0  For NST scheduled  1. IUP at 38 2/7 weeks with reactive NST  2. PMH of Low birth weight infant  3. LGSIL on Pap 2015 with no f/u, needs pp colp.

## 2015-11-26 ENCOUNTER — Inpatient Hospital Stay
Admission: RE | Admit: 2015-11-26 | Discharge: 2015-11-26 | Disposition: A | Discharge status: Home / Self Care | Payer: Medicaid Other | Attending: Obstetrics & Gynecology | Admitting: Obstetrics & Gynecology

## 2015-11-26 ENCOUNTER — Inpatient Hospital Stay: Payer: Medicaid Other

## 2015-11-26 DIAGNOSIS — O365930 Maternal care for other known or suspected poor fetal growth, third trimester, not applicable or unspecified: Secondary | ICD-10-CM | POA: Insufficient documentation

## 2015-11-26 DIAGNOSIS — Z3A38 38 weeks gestation of pregnancy: Secondary | ICD-10-CM | POA: Diagnosis not present

## 2015-11-26 DIAGNOSIS — O365990 Maternal care for other known or suspected poor fetal growth, unspecified trimester, not applicable or unspecified: Secondary | ICD-10-CM

## 2015-11-26 NOTE — Discharge Summary (Signed)
Carolyn Petersen is a 25 y.o. female. She is at 6368w4d gestation.  Chief Complaint: IUGR  S: Resting comfortably. no CTX, no VB.no LOF,  Active fetal movement.    Maternal Medical History:  No past medical history on file.  No past surgical history on file.  No Known Allergies  Prior to Admission medications   Medication Sig Start Date End Date Taking? Authorizing Provider  cyclobenzaprine (FLEXERIL) 10 MG tablet Take 1 tablet (10 mg total) by mouth every 8 (eight) hours as needed for muscle spasms. 06/23/15 06/22/16  Emily FilbertJonathan E Williams, MD  ranitidine (ZANTAC) 150 MG tablet Take 1 tablet (150 mg total) by mouth at bedtime. 10/14/15 10/13/16  Jennye MoccasinBrian S Quigley, MD     [facility]  Social History: She  reports that she has never smoked. She has never used smokeless tobacco. She reports that she does not drink alcohol.  Family History: family history is not on file.   Review of Systems: A full review of systems was performed and negative except as noted in the HPI.     O:119/58, p: 80  LMP 03/01/2015 (Approximate)  No results found for this or any previous visit (from the past 48 hour(s)).   Constitutional: NAD, AAOx3   FHT  140 mod + accels no decels -- SEE NST PROCEDURE NOTE Toco: irritable  Ultrasound [place] Ob Limited  Result Date: 11/26/2015 CLINICAL DATA:  Evaluation of the amniotic fluid volume over concern with respect of fetal growth EXAM: LIMITED OBSTETRIC ULTRASOUND FINDINGS: Number of Fetuses:  1 Heart Rate:  132 bpm Movement:  Note movement appreciable Presentation: Cephalic Placental Location: Anterior Previa: None.  No demonstrable placental abruption. Amniotic Fluid (Subjective): Within normal limits for gestational age. AFI measured at 6.2 cm BPD:  8.9cm 36w  0d MATERNAL FINDINGS: Cervix:  Appears closed. Uterus/Adnexae:  No abnormality visualized. IMPRESSION: Single live intrauterine gestation in cephalic position. AFI measured at  6.2 cm. Note that fetal motion was not detectable during the course of the study. This significance of this finding is uncertain given that fetal heart activity is noted. No placental abruption or placenta previa. Biparietal diameter measures 8.9 cm with an estimated gestational age of [redacted] weeks. This exam is performed on an emergent basis to address a specific clinical concern and does not comprehensively evaluate fetal size, dating, or anatomy; follow-up complete OB US should be considered if further fetal assessment is warranted. Electronically Signed   By: Bretta BangWilliam  Woodruff III M.D.   On: 11/26/2015 16:15      A/P:  25yo with known IUGR for antenatal testing.   Labor: not present.   Fetal Wellbeing: Reassuring Cat 1 tracing, NST reactive   Low amniotic fluid - not yet diagnostic of oligohydramnios for EGA.  Repeat Monday  IUGR - IOL @ 39wks, plan TBD  D/c home stable, precautions reviewed, follow-up as scheduled.   ----- Ranae Plumberhelsea Deavion Strider, MD Attending Obstetrician and Gynecologist [facility], Department of OB/GYN [facility]

## 2015-11-26 NOTE — Progress Notes (Signed)
Pt. To U/S for OB limited.

## 2015-11-26 NOTE — Discharge Instructions (Signed)
[facility]  For [facility]ST and repeat AFI.    Return for decreased fetal movement, persistent contractions, vaginal bleeding or leaking of fluid.

## 2015-11-26 NOTE — Progress Notes (Signed)
Pt. Discharged to home with S.O. Labor precautions discussed with patient, pt. Verbalized understanding.

## 2015-11-26 NOTE — Progress Notes (Addendum)
1650 - RN received a call from U/S regarding results of OB U/S - see report. RN notified MD on call (Dr. Elesa MassedWard).   No new orders received.

## 2015-11-27 NOTE — Procedures (Signed)
  Baseline: 140 Variability: moderate Accelerations:  Present, 15x15, >x2 Decelerations: negative  +fetal movement during exam Patient brought to US for AFI today.  Interpretation: Reactive NST, Category 1  ----- Carolyn Plumberhelsea Roshawn Ayala, MD Attending Obstetrician and Gynecologist [facility], Department of OB/GYN [facility]

## 2015-11-30 ENCOUNTER — Inpatient Hospital Stay
Admit: 2015-11-30 | Discharge: 2015-12-03 | DRG: 775 | Disposition: A | Discharge status: Home / Self Care | Payer: Medicaid Other | Attending: Obstetrics and Gynecology | Admitting: Obstetrics and Gynecology

## 2015-11-30 DIAGNOSIS — Z3A39 39 weeks gestation of pregnancy: Secondary | ICD-10-CM | POA: Diagnosis not present

## 2015-11-30 DIAGNOSIS — O99824 Streptococcus B carrier state complicating childbirth: Secondary | ICD-10-CM | POA: Diagnosis present

## 2015-11-30 DIAGNOSIS — R87612 Low grade squamous intraepithelial lesion on cytologic smear of cervix (LGSIL): Secondary | ICD-10-CM | POA: Diagnosis present

## 2015-11-30 DIAGNOSIS — O365930 Maternal care for other known or suspected poor fetal growth, third trimester, not applicable or unspecified: Secondary | ICD-10-CM | POA: Diagnosis present

## 2015-11-30 DIAGNOSIS — Z3483 Encounter for supervision of other normal pregnancy, third trimester: Secondary | ICD-10-CM | POA: Diagnosis present

## 2015-11-30 DIAGNOSIS — Z23 Encounter for immunization: Secondary | ICD-10-CM | POA: Diagnosis not present

## 2015-11-30 DIAGNOSIS — O479 False labor, unspecified: Secondary | ICD-10-CM | POA: Diagnosis present

## 2015-11-30 LAB — CBC
HCT: 37.2 % (ref 35.0–47.0)
Hemoglobin: 12.8 g/dL (ref 12.0–16.0)
MCH: 31.1 pg (ref 26.0–34.0)
MCHC: 34.3 g/dL (ref 32.0–36.0)
MCV: 90.5 fL (ref 80.0–100.0)
PLATELETS: 180 10*3/uL (ref 150–440)
RBC: 4.11 MIL/uL (ref 3.80–5.20)
RDW: 13.8 % (ref 11.5–14.5)
WBC: 9.1 10*3/uL (ref 3.6–11.0)

## 2015-11-30 MED ORDER — BUTORPHANOL TARTRATE 1 MG/ML IJ SOLN
1.0000 mg | INTRAMUSCULAR | Status: DC | PRN
  Administered 2015-11-30: 1 mg via INTRAVENOUS

## 2015-11-30 MED ORDER — LACTATED RINGERS IV SOLN
500.0000 mL | INTRAVENOUS | Status: DC | PRN
  Administered 2015-11-30: 500 mL via INTRAVENOUS

## 2015-11-30 MED ORDER — LIDOCAINE HCL (PF) 1 % IJ SOLN: 30.0000 mL | INTRAMUSCULAR | Status: DC | PRN

## 2015-11-30 MED ORDER — BUTORPHANOL TARTRATE 1 MG/ML IJ SOLN
INTRAMUSCULAR | Status: AC
  Filled 2015-11-30: qty 1

## 2015-11-30 MED ORDER — OXYTOCIN BOLUS FROM INFUSION: 500.0000 mL | Freq: Once | INTRAVENOUS | Status: DC

## 2015-11-30 MED ORDER — OXYCODONE-ACETAMINOPHEN 5-325 MG PO TABS
1.0000 | ORAL_TABLET | ORAL | Status: DC | PRN
  Administered 2015-12-01 (×2): 1 via ORAL
  Filled 2015-11-30 (×2): qty 1

## 2015-11-30 MED ORDER — AMPICILLIN SODIUM 2 G IJ SOLR
INTRAMUSCULAR | Status: AC
  Administered 2015-11-30: 2 g via INTRAVENOUS
  Filled 2015-11-30: qty 2000

## 2015-11-30 MED ORDER — SOD CITRATE-CITRIC ACID 500-334 MG/5ML PO SOLN
30.0000 mL | ORAL | Status: DC | PRN
  Filled 2015-11-30: qty 30

## 2015-11-30 MED ORDER — LACTATED RINGERS IV SOLN
INTRAVENOUS | Status: DC
  Administered 2015-11-30: via INTRAVENOUS

## 2015-11-30 MED ORDER — ONDANSETRON HCL 4 MG/2ML IJ SOLN: 4.0000 mg | Freq: Four times a day (QID) | INTRAMUSCULAR | Status: DC | PRN

## 2015-11-30 MED ORDER — ACETAMINOPHEN 325 MG PO TABS: 650.0000 mg | ORAL_TABLET | ORAL | Status: DC | PRN

## 2015-11-30 MED ORDER — OXYTOCIN 40 UNITS IN LACTATED RINGERS INFUSION - SIMPLE MED: 2.5000 [IU]/h | INTRAVENOUS | Status: DC

## 2015-11-30 MED ORDER — SODIUM CHLORIDE 0.9 % IV SOLN
2.0000 g | INTRAVENOUS | Status: DC
  Administered 2015-11-30: 2 g via INTRAVENOUS
  Filled 2015-11-30 (×4): qty 2000

## 2015-11-30 MED ORDER — OXYCODONE-ACETAMINOPHEN 5-325 MG PO TABS
2.0000 | ORAL_TABLET | ORAL | Status: DC | PRN
  Administered 2015-12-02 (×3): 2 via ORAL
  Filled 2015-11-30 (×4): qty 2

## 2015-11-30 NOTE — H&P (Signed)
Martie RoundJalesia Kratzke is a 25 y.o. female presenting for labor. OB History    Gravida Para Term Preterm AB Living   4 3 3     3    SAB TAB Ectopic Multiple Live Births           3     History reviewed. No pertinent past medical history. History reviewed. No pertinent surgical history. Family History: family history is not on file. Social History:  reports that she has never smoked. She has never used smokeless tobacco. She reports that she does not drink alcohol or use drugs.     Maternal Diabetes: No Genetic Screening: Normal Maternal Ultrasounds/Referrals: Normal Fetal Ultrasounds or other Referrals:  None Maternal Substance Abuse:  No Significant Maternal Medications:  None Significant Maternal Lab Results:  None Other Comments:  None  ROS History   Blood pressure 120/64, pulse 94, temperature 97.7 F (36.5 C), temperature source Oral, resp. rate 20, height 5\' 2"  (1.575 m), weight 149 lb (67.6 kg), last menstrual period 03/01/2015. Exam  Lungs CTA  cv RRR cx 5 cm  Per RN  Physical Exam  Prenatal labs: ABO, Rh:  O+ Antibody:  neg Rubella:  imm RPR:   neg HBsAg:   neg HIV:   neg GBS:   +  Assessment/Plan: Active labor  anticipate SVD    SCHERMERHORN,THOMAS 11/30/2015, 11:19 PM

## 2015-12-01 ENCOUNTER — Inpatient Hospital Stay: Payer: Medicaid Other | Admitting: Anesthesiology

## 2015-12-01 ENCOUNTER — Encounter: Payer: Self-pay | Admitting: Anesthesiology

## 2015-12-01 LAB — CBC
HCT: 35.6 % (ref 35.0–47.0)
Hemoglobin: 12.3 g/dL (ref 12.0–16.0)
MCH: 31 pg (ref 26.0–34.0)
MCHC: 34.7 g/dL (ref 32.0–36.0)
MCV: 89.3 fL (ref 80.0–100.0)
PLATELETS: 180 10*3/uL (ref 150–440)
RBC: 3.98 MIL/uL (ref 3.80–5.20)
RDW: 13.9 % (ref 11.5–14.5)
WBC: 12.4 10*3/uL — ABNORMAL HIGH (ref 3.6–11.0)

## 2015-12-01 LAB — TYPE AND SCREEN
ABO/RH(D): O POS
ANTIBODY SCREEN: NEGATIVE

## 2015-12-01 MED ORDER — PRENATAL MULTIVITAMIN CH
1.0000 | ORAL_TABLET | Freq: Every day | ORAL | Status: DC
  Administered 2015-12-01 – 2015-12-03 (×2): 1 via ORAL
  Filled 2015-12-01 (×2): qty 1

## 2015-12-01 MED ORDER — FENTANYL 2.5 MCG/ML W/ROPIVACAINE 0.2% IN NS 100 ML EPIDURAL INFUSION (ARMC-ANES)
EPIDURAL | Status: AC
  Filled 2015-12-01: qty 100

## 2015-12-01 MED ORDER — MEASLES, MUMPS & RUBELLA VAC ~~LOC~~ INJ
0.5000 mL | INJECTION | Freq: Once | SUBCUTANEOUS | Status: DC
  Filled 2015-12-01: qty 0.5

## 2015-12-01 MED ORDER — DIPHENHYDRAMINE HCL 50 MG/ML IJ SOLN: 12.5000 mg | INTRAMUSCULAR | Status: DC | PRN

## 2015-12-01 MED ORDER — ACETAMINOPHEN 325 MG PO TABS: 650.0000 mg | ORAL_TABLET | ORAL | Status: DC | PRN

## 2015-12-01 MED ORDER — LACTATED RINGERS IV SOLN: 500.0000 mL | Freq: Once | INTRAVENOUS | Status: DC

## 2015-12-01 MED ORDER — IBUPROFEN 600 MG PO TABS
600.0000 mg | ORAL_TABLET | Freq: Four times a day (QID) | ORAL | Status: DC
  Administered 2015-12-01 – 2015-12-03 (×7): 600 mg via ORAL
  Filled 2015-12-01 (×7): qty 1

## 2015-12-01 MED ORDER — EPHEDRINE 5 MG/ML INJ
10.0000 mg | INTRAVENOUS | Status: DC | PRN
  Filled 2015-12-01: qty 2

## 2015-12-01 MED ORDER — ONDANSETRON HCL 4 MG PO TABS: 4.0000 mg | ORAL_TABLET | ORAL | Status: DC | PRN

## 2015-12-01 MED ORDER — FENTANYL 2.5 MCG/ML W/ROPIVACAINE 0.2% IN NS 100 ML EPIDURAL INFUSION (ARMC-ANES)
EPIDURAL | Status: DC | PRN
  Administered 2015-12-01: 10 mL/h via EPIDURAL

## 2015-12-01 MED ORDER — AMMONIA AROMATIC IN INHA
RESPIRATORY_TRACT | Status: AC
  Filled 2015-12-01: qty 10

## 2015-12-01 MED ORDER — BUPIVACAINE HCL (PF) 0.25 % IJ SOLN
INTRAMUSCULAR | Status: DC | PRN
  Administered 2015-12-01: 5 mL via EPIDURAL

## 2015-12-01 MED ORDER — IBUPROFEN 600 MG PO TABS
600.0000 mg | ORAL_TABLET | Freq: Four times a day (QID) | ORAL | Status: DC
  Administered 2015-12-01: 600 mg via ORAL
  Filled 2015-12-01: qty 1

## 2015-12-01 MED ORDER — COCONUT OIL OIL: 1.0000 "application " | TOPICAL_OIL | Status: DC | PRN

## 2015-12-01 MED ORDER — OXYTOCIN 40 UNITS IN LACTATED RINGERS INFUSION - SIMPLE MED
INTRAVENOUS | Status: AC
  Filled 2015-12-01: qty 1000

## 2015-12-01 MED ORDER — FERROUS SULFATE 325 (65 FE) MG PO TABS
325.0000 mg | ORAL_TABLET | Freq: Two times a day (BID) | ORAL | Status: DC
  Administered 2015-12-01 – 2015-12-03 (×5): 325 mg via ORAL
  Filled 2015-12-01 (×5): qty 1

## 2015-12-01 MED ORDER — PHENYLEPHRINE 40 MCG/ML (10ML) SYRINGE FOR IV PUSH (FOR BLOOD PRESSURE SUPPORT)
80.0000 ug | PREFILLED_SYRINGE | INTRAVENOUS | Status: DC | PRN
  Filled 2015-12-01: qty 5

## 2015-12-01 MED ORDER — DIBUCAINE 1 % RE OINT: 1.0000 "application " | TOPICAL_OINTMENT | RECTAL | Status: DC | PRN

## 2015-12-01 MED ORDER — FENTANYL 2.5 MCG/ML W/ROPIVACAINE 0.2% IN NS 100 ML EPIDURAL INFUSION (ARMC-ANES): 10.0000 mL/h | EPIDURAL | Status: DC

## 2015-12-01 MED ORDER — MISOPROSTOL 200 MCG PO TABS
ORAL_TABLET | ORAL | Status: AC
  Filled 2015-12-01: qty 4

## 2015-12-01 MED ORDER — DIPHENHYDRAMINE HCL 25 MG PO CAPS: 25.0000 mg | ORAL_CAPSULE | Freq: Four times a day (QID) | ORAL | Status: DC | PRN

## 2015-12-01 MED ORDER — SIMETHICONE 80 MG PO CHEW: 80.0000 mg | CHEWABLE_TABLET | ORAL | Status: DC | PRN

## 2015-12-01 MED ORDER — SENNOSIDES-DOCUSATE SODIUM 8.6-50 MG PO TABS
2.0000 | ORAL_TABLET | ORAL | Status: DC
  Administered 2015-12-01 – 2015-12-03 (×2): 2 via ORAL
  Filled 2015-12-01 (×2): qty 2

## 2015-12-01 MED ORDER — LIDOCAINE HCL (PF) 1 % IJ SOLN
INTRAMUSCULAR | Status: AC
  Filled 2015-12-01: qty 30

## 2015-12-01 MED ORDER — ZOLPIDEM TARTRATE 5 MG PO TABS: 5.0000 mg | ORAL_TABLET | Freq: Every evening | ORAL | Status: DC | PRN

## 2015-12-01 MED ORDER — MAGNESIUM HYDROXIDE 400 MG/5ML PO SUSP: 30.0000 mL | ORAL | Status: DC | PRN

## 2015-12-01 MED ORDER — BENZOCAINE-MENTHOL 20-0.5 % EX AERO: 1.0000 "application " | INHALATION_SPRAY | CUTANEOUS | Status: DC | PRN

## 2015-12-01 MED ORDER — ONDANSETRON HCL 4 MG/2ML IJ SOLN: 4.0000 mg | INTRAMUSCULAR | Status: DC | PRN

## 2015-12-01 MED ORDER — WITCH HAZEL-GLYCERIN EX PADS: 1.0000 "application " | MEDICATED_PAD | CUTANEOUS | Status: DC | PRN

## 2015-12-01 MED ORDER — OXYTOCIN 10 UNIT/ML IJ SOLN
INTRAMUSCULAR | Status: AC
  Filled 2015-12-01: qty 2

## 2015-12-01 NOTE — Anesthesia Procedure Notes (Signed)
Epidural Patient location during procedure: OB  Staffing Anesthesiologist: Berdine AddisonHOMAS, Jonee Lamore Performed: anesthesiologist   Preanesthetic Checklist Completed: patient identified, site marked, surgical consent, pre-op evaluation, timeout performed, IV checked, risks and benefits discussed and monitors and equipment checked  Epidural Patient position: sitting Prep: Betadine Patient monitoring: heart rate, continuous pulse ox and blood pressure Approach: midline Location: L4-L5 Injection technique: LOR saline  Needle:  Needle type: Tuohy  Needle gauge: 18 G Needle length: 9 cm and 9 Catheter type: closed end flexible Catheter size: 20 Guage Test dose: negative and 1.5% lidocaine with Epi 1:200 K  Assessment Sensory level: T10 Events: blood not aspirated, injection not painful, no injection resistance, negative IV test and no paresthesia  Additional Notes   Patient tolerated the insertion well without complications. 0031 test. 0033 bolus. 0038 infusion.Reason for block:procedure for pain

## 2015-12-01 NOTE — Anesthesia Preprocedure Evaluation (Signed)
Anesthesia Evaluation  Patient identified by MRN, date of birth, ID band Patient awake    Reviewed: Allergy & Precautions, NPO status , Patient's Chart, lab work & pertinent test results, reviewed documented beta blocker date and time   Airway Mallampati: II  TM Distance: >3 FB     Dental  (+) Chipped   Pulmonary           Cardiovascular      Neuro/Psych    GI/Hepatic   Endo/Other    Renal/GU      Musculoskeletal   Abdominal   Peds  Hematology   Anesthesia Other Findings   Reproductive/Obstetrics                             Anesthesia Physical Anesthesia Plan  ASA: II  Anesthesia Plan: Epidural   Post-op Pain Management:    Induction:   Airway Management Planned:   Additional Equipment:   Intra-op Plan:   Post-operative Plan:   Informed Consent: I have reviewed the patients History and Physical, chart, labs and discussed the procedure including the risks, benefits and alternatives for the proposed anesthesia with the patient or authorized representative who has indicated his/her understanding and acceptance.     Plan Discussed with: CRNA  Anesthesia Plan Comments:         Anesthesia Quick Evaluation  

## 2015-12-02 LAB — RPR: RPR Ser Ql: NONREACTIVE

## 2015-12-02 NOTE — Progress Notes (Signed)
PPD #1, SVD, baby boy [place]   S:  Reports feeling good, but tired, states she is not getting much rest - reports lower abd cramping and back pain at epidural site, but states meds are helping              Tolerating po/ No nausea or vomiting             Bleeding is light             Pain controlled with Motrin and Percocet             Up ad lib / ambulatory / voiding QS  Newborn formula feeding  O:               VS: BP 100/60 (BP Location: Left Arm)   Pulse 76   Temp 98.1 F (36.7 C) (Oral)   Resp 18   Ht 5\' 2"  (1.575 m)   Wt 67.6 kg (149 lb)   LMP 03/01/2015 (Approximate)   SpO2 100%   Breastfeeding? Unknown   BMI 27.25 kg/m    LABS:              Recent Labs  11/30/15 2336 12/01/15 0845  WBC 9.1 12.4*  HGB 12.8 12.3  PLT 180 180               Blood type: --/--/O POS (10/17 2336)  Rubella:    Immune                   I&O: Intake/Output    None                 Physical Exam:             Alert and oriented X3  Lungs: Clear and unlabored  Heart: regular rate and rhythm / no mumurs  Abdomen: soft, non-tender, non-distended              Fundus: firm, non-tender, U-1  Perineum: intact, no significant edema or erythema  Lochia: light   Extremities: no edema, no calf pain or tenderness    A: PPD # 1   Doing well - stable status  LGSIL - needs colposcopy PP  GBS Positive with inadequate treatment - baby stable  P: Routine post partum orders  Continue current therapy  Contraception: wants Depo prior to discharge, then Nexplanon  Anticipate Discharge home tomorrow  Carlean JewsMeredith Dickey Caamano, CNM

## 2015-12-02 NOTE — Anesthesia Postprocedure Evaluation (Signed)
Anesthesia Post Note  Patient: Carolyn Petersen  Procedure(s) Performed: * No procedures listed *  Patient location during evaluation: Mother Baby Anesthesia Type: Epidural Level of consciousness: oriented and awake and alert Pain management: pain level controlled Vital Signs Assessment: post-procedure vital signs reviewed and stable Respiratory status: spontaneous breathing Cardiovascular status: blood pressure returned to baseline Postop Assessment: no headache, no backache and no signs of nausea or vomiting Anesthetic complications: no    Last Vitals:  Vitals:   12/01/15 1938 12/02/15 0022  BP: (!) 102/49 113/65  Pulse: 72 69  Resp: 18 18  Temp: 36.6 C 36.3 C    Last Pain:  Vitals:   12/02/15 0120  TempSrc:   PainSc: Asleep                 Starling Mannsurtis,  Kingslee Dowse A

## 2015-12-03 MED ORDER — MEDROXYPROGESTERONE ACETATE 150 MG/ML IM SUSP
150.0000 mg | Freq: Once | INTRAMUSCULAR | Status: AC
Start: 2015-12-03 — End: 2015-12-03
  Administered 2015-12-03: 150 mg via INTRAMUSCULAR
  Filled 2015-12-03: qty 1

## 2015-12-03 MED ORDER — INFLUENZA VAC SPLIT QUAD 0.5 ML IM SUSY: 0.5000 mL | PREFILLED_SYRINGE | INTRAMUSCULAR | Status: DC

## 2015-12-03 NOTE — Discharge Summary (Signed)
Obstetric Discharge Summary   Patient ID: Carolyn Petersen MRN: [id number] DOB/AGE: [birth year] 25 y.o.   Date of Admission: 11/30/2015  Date of Discharge: 12/03/15  Admitting Diagnosis: IUP at 3766w2d  Secondary Diagnosis: IUGR with low AFI  Mode of Delivery: NSVD of viable female     Discharge Diagnosis: Term infant with NSVD with no repairs   Intrapartum Procedures: Fetal and uterine monitoring   Post partum procedures: None  Complications:None   Brief Hospital Course  Carolyn Petersen is a [id number] who had a SVD on 12/01/15;  for further details of this delivery, please refer to the delivery note.  Patient had an uncomplicated postpartum course.  By time of discharge on PPD#2, her pain was controlled on oral pain medications; she had appropriate lochia and was ambulating, voiding without difficulty and tolerating regular diet.  She was deemed stable for discharge to home.        Labs: CBC Latest Ref Rng & Units 12/01/2015 11/30/2015 10/14/2015  WBC 3.6 - 11.0 K/uL 12.4(H) 9.1 6.5  Hemoglobin 12.0 - 16.0 g/dL 14.712.3 82.912.8 56.212.0  Hematocrit 35.0 - 47.0 % 35.6 37.2 33.4(L)  Platelets 150 - 440 K/uL 180 180 162   O POS  Physical exam:  Blood pressure 129/71, pulse 76, temperature 98.2 F (36.8 C), temperature source Oral, resp. rate 18, height 5\' 2"  (1.575 m), weight 149 lb (67.6 kg), last menstrual period 03/01/2015, SpO2 99 %, unknown if currently breastfeeding. General: alert and no distress Lungs: CTA bilat, no W/R/R. Heart: S1S2, RRR, NO M/R/G.  Lochia: appropriate, no clots Abdomen: soft, NT Uterine Fundus: firm, U-2 Extremities: No evidence of DVT seen on physical exam. No lower extremity edema.  Discharge Instructions: Per After Visit Summary. Activity: Advance as tolerated. Pelvic rest for 6 weeks.  Also refer to After Visit Summary Diet: Regular Medications:   Medication List    STOP taking these medications   cyclobenzaprine 10 MG tablet Commonly known  as:  FLEXERIL   ranitidine 150 MG tablet Commonly known as:  ZANTAC      Outpatient follow up:  Postpartum contraception: DepoProvera 150 mg IM today and fu in 3 months  Discharged Condition: Stable   Discharged to: Home   Newborn Data:  Baby  "Boy"   Disposition:Home with mom  Apgars: APGAR (1 MIN): 8   APGAR (5 MINS): 9   APGAR (10 MINS):    Baby Feeding: Bottlefeeding  Sharee Pimplearon W Grigor Lipschutz, CNM 12/03/2015

## 2015-12-03 NOTE — Discharge Instructions (Signed)
Please call your doctor or return to the ER if you experience any chest pains, shortness of breath, fever greater than 101, any heavy bleeding or large clots, and foul smelling vaginal discharge, any worsening abdominal pain & cramping that is not controlled by pain medication, or any signs of post partum depression.  No tampons, enemas, douches, or sexual intercourse for 6 weeks.  Also avoid tub baths, hot tubs, or swimming for 6 weeks.  Human Papillomavirus Human papillomavirus (HPV) is the most common sexually transmitted infection (STI) and is highly contagious. HPV infections cause genital warts and cancers to the outlet of the womb (cervix), birth canal (vagina), opening of the birth canal (vulva), and anus. There are over 100 types of HPV. Unless wartlike lesions are present in the throat or there are genital warts that you can see or feel, HPV usually does not cause symptoms. It is possible to be infected for long periods and pass it on to others without knowing it. CAUSES  HPV is spread from person to person through sexual contact. This includes oral, vaginal, or anal sex. RISK FACTORS  Having unprotected sex. HPV can be spread by oral, vaginal, or anal sex.  Having several sex partners.  Having a sex partner who has other sex partners.  Having or having had another sexually transmitted infection. SIGNS AND SYMPTOMS  Most people carrying HPV do not have any symptoms. If symptoms are present, symptoms may include:  Wartlike lesions in the throat (from having oral sex).  Warts in the infected skin or mucous membranes.  Genital warts that may itch, burn, or bleed.  Genital warts that may be painful or bleed during sexual intercourse. DIAGNOSIS  If wartlike lesions are present in the throat or genital warts are present, your health care provider can usually diagnose HPV by physical examination.   Genital warts are easily seen with the naked eye.  Currently, there is no FDA-approved  test to detect HPV in males.  In females, a Pap test can show cells that are infected with HPV.  In females, a scope can be used to view the cervix (colposcopy). A colposcopy can be performed if the pelvic exam or Pap test is abnormal. A sample of tissue may be removed (biopsy) during the colposcopy. TREATMENT  There is no treatment for the virus itself. However, there are treatments for the health problems and symptoms HPV can cause. Your health care provider will follow you closely after you are treated. This is because the HPV can come back and may need treatment again. Treatment of HPV may include:   Medicines, which may be injected or applied in a cream, lotion, or gel form.  Use of a probe to apply extreme cold (cryotherapy).  Application of an intense beam of light (laser treatment).  Use of a probe to apply extreme heat (electrocautery).  Surgery. HOME CARE INSTRUCTIONS   Take medicines only as directed by your health care provider.  Use over-the-counter creams for itching or irritation as directed by your health care provider.  Keep all follow-up visits as directed by your health care provider. This is important.  Do not touch or scratch the warts.  Do not treat genital warts with medicines used for treating hand warts.  Do not have sex while you are being treated.  Do not douche or use tampons during treatment of HPV.  Tell your sex partner about your infection because he or she may also need treatment.  If you become pregnant, tell  your health care provider that you have had HPV. Your health care provider will monitor you closely during pregnancy to be sure your baby is safe.  After treatment, use condoms during sex to prevent future infections.  Have only one sex partner.  Have a sex partner who does not have other sex partners. PREVENTION   Talk to your health care provider about getting the HPV vaccines. These vaccines prevent some HPV infections and cancers.  It is recommended that the vaccine be given to males and females between the ages of 9 and 6626 yea60rs old. It will not work if you already have HPV, and it is not recommended for pregnant women.  A Pap test is done to screen for cervical cancer in women.  The first Pap test should be done at age 26 years.  Between ages 5221 and 4529 years, Pap tests are repeated every 2 years.  Beginning at age 26, you are advised to have a Pap test every 3 years as long as your past 3 Pap tests have been normal.  Some women have medical problems that increase the chance of getting cervical cancer. Talk to your health care provider about these problems. It is especially important to talk to your health care provider if a new problem develops soon after your last Pap test. In these cases, your health care provider may recommend more frequent screening and Pap tests.  The above recommendations are the same for women who have or have not gotten the vaccine for HPV.  If you had a hysterectomy for a problem that was not a cancer or a condition that could lead to cancer, then you no longer need Pap tests. However, even if you no longer need a Pap test, a regular exam is a good idea to make sure no other problems are starting.   If you are between the ages of 5965 and 6670 years and you have had normal Pap tests going back 10 years, you no longer need Pap tests. However, even if you no longer need a Pap test, a regular exam is a good idea to make sure no other problems are starting.  If you have had past treatment for cervical cancer or a condition that could lead to cancer, you need Pap tests and screening for cancer for at least 20 years after your treatment.  If Pap tests have been discontinued, risk factors (such as a new sexual partner)need to be reassessed to determine if screening should be resumed.  Some women may need screenings more often if they are at high risk for cervical cancer. SEEK MEDICAL CARE IF:   The  treated skin becomes red, swollen, or painful.  You have a fever.  You feel generally ill.  You feel lumps or pimple-like projections in and around your genital area.  You develop bleeding of the vagina or the treatment area.  You have painful sexual intercourse. MAKE SURE YOU:   Understand these instructions.  Will watch your condition.  Will get help if you are not doing well or get worse.   This information is not intended to replace advice given to you by your health care provider. Make sure you discuss any questions you have with your health care provider.   Document Released: 04/22/2003 Document Revised: 02/20/2014 Document Reviewed: 05/07/2013 Elsevier Interactive Patient Education 2016 [employer] Pregnancy and Anemia Anemia is a condition in which the concentration of red blood cells or hemoglobin in the blood is below normal. Hemoglobin  is a substance in red blood cells that carries oxygen to the tissues of the body. Anemia results in not enough oxygen reaching these tissues.  Anemia during pregnancy is common because the fetus uses more iron and folic acid as it is developing. Your body may not produce enough red blood cells because of this. Also, during pregnancy, the liquid part of the blood (plasma) increases by about 50%, and the red blood cells increase by only 25%. This lowers the concentration of the red blood cells and creates a natural anemia-like situation.  CAUSES  The most common cause of anemia during pregnancy is not having enough iron in the body to make red blood cells (iron deficiency anemia). Other causes may include:  Folic acid deficiency.  Vitamin B12 deficiency.  Certain prescription or over-the-counter medicines.  Certain medical conditions or infections that destroy red blood cells.  A low platelet count and bleeding caused by antibodies that go through the placenta to the fetus from the mother's blood. SIGNS AND SYMPTOMS  Mild anemia may  not be noticeable. If it becomes severe, symptoms may include:  Tiredness.  Shortness of breath, especially with exercise.  Weakness.  Fainting.  Pale looking skin.  Headaches.  Feeling a fast or irregular heartbeat (palpitations). DIAGNOSIS  The type of anemia is usually diagnosed from your family and medical history and blood tests. TREATMENT  Treatment of anemia during pregnancy depends on the cause of the anemia. Treatment can include:  Supplements of iron, vitamin B12, or folic acid.  A blood transfusion. This may be needed if blood loss is severe.  Hospitalization. This may be needed if there is significant continual blood loss.  Dietary changes. HOME CARE INSTRUCTIONS   Follow your dietitian's or health care provider's dietary recommendations.  Increase your vitamin C intake. This will help the stomach absorb more iron.  Eat a diet rich in iron. This would include foods such as:  Liver.  Beef.  Whole grain bread.  Eggs.  Dried fruit.  Take iron and vitamins as directed by your health care provider.  Eat green leafy vegetables. These are a good source of folic acid. SEEK MEDICAL CARE IF:   You have frequent or lasting headaches.  You are looking pale.  You are bruising easily. SEEK IMMEDIATE MEDICAL CARE IF:   You have extreme weakness, shortness of breath, or chest pain.  You become dizzy or have trouble concentrating.  You have heavy vaginal bleeding.  You develop a rash.  You have bloody or black, tarry stools.  You faint.  You vomit up blood.  You vomit repeatedly.  You have abdominal pain.  You have a fever or persistent symptoms for more than 2-3 days.  You have a fever and your symptoms suddenly get worse.  You are dehydrated. MAKE SURE YOU:   Understand these instructions.  Will watch your condition.  Will get help right away if you are not doing well or get worse.   This information is not intended to replace  advice given to you by your health care provider. Make sure you discuss any questions you have with your health care provider.   Document Released: 01/28/2000 Document Revised: 11/20/2012 Document Reviewed: 09/11/2012 Elsevier Interactive Patient Education 2016 [employer]. Care After Vaginal Delivery Congratulations on your new baby!!  Refer to this sheet in the next few weeks. These discharge instructions provide you with information on caring for yourself after delivery. Your caregiver may also give you specific instructions. Your treatment  has been planned according to the most current medical practices available, but problems sometimes occur. Call your caregiver if you have any problems or questions after you go home.  HOME CARE INSTRUCTIONS  Take over-the-counter or prescription medicines only as directed by your caregiver or pharmacist.  Do not drink alcohol, especially if you are breastfeeding or taking medicine to relieve pain.  Do not chew or smoke tobacco.  Do not use illegal drugs.  Continue to use good perineal care. Good perineal care includes:  Wiping your perineum from front to back.  Keeping your perineum clean.  Do not use tampons or douche until your caregiver says it is okay.  Shower, wash your hair, and take tub baths as directed by your caregiver.  Wear a well-fitting bra that provides breast support.  Eat healthy foods.  Drink enough fluids to keep your urine clear or pale yellow.  Eat high-fiber foods such as whole grain cereals and breads, brown rice, beans, and fresh fruits and vegetables every day. These foods may help prevent or relieve constipation.  Follow your caregiver's recommendations regarding resumption of activities such as climbing stairs, driving, lifting, exercising, or traveling. Specifically, no driving for two weeks, so that you are comfortable reacting quickly in an emergency.  Talk to your caregiver about resuming sexual  activities. Resumption of sexual activities is dependent upon your risk of infection, your rate of healing, and your comfort and desire to resume sexual activity. Usually we recommend waiting about six weeks, or until your bleeding stops and you are interested in sex.  Try to have someone help you with your household activities and your newborn for at least a few days after you leave the hospital. Even longer is better.  Rest as much as possible. Try to rest or take a nap when your newborn is sleeping. Sleep deprivation can be very hard after delivery.  Increase your activities gradually.  Keep all of your scheduled postpartum appointments. It is very important to keep your scheduled follow-up appointments. At these appointments, your caregiver will be checking to make sure that you are healing physically and emotionally.  SEEK MEDICAL CARE IF:   You are passing large clots from your vagina.   You have a foul smelling discharge from your vagina.  You have trouble urinating.  You are urinating frequently.  You have pain when you urinate.  You have a change in your bowel movements.  You have increasing redness, pain, or swelling near your vaginal incision (episiotomy) or vaginal tear.  You have pus draining from your episiotomy or vaginal tear.  Your episiotomy or vaginal tear is separating.  You have painful, hard, or reddened breasts.  You have a severe headache.  You have blurred vision or see spots.  You feel sad or depressed.  You have thoughts of hurting yourself or your newborn.  You have questions about your care, the care of your newborn, or medicines.  You are dizzy or light-headed.  You have a rash.  You have nausea or vomiting.  You were breastfeeding and have not had a menstrual period within 12 weeks after you stopped breastfeeding.  You are not breastfeeding and have not had a menstrual period by the 12th week after delivery.  You have a fever.  SEEK  IMMEDIATE MEDICAL CARE IF:   You have persistent pain.  You have chest pain.  You have shortness of breath.  You faint.  You have leg pain.  You have stomach pain.  Your  vaginal bleeding saturates two or more sanitary pads in 1 hour.  MAKE SURE YOU:   Understand these instructions.  Will get help right away if you are not doing well or get worse.  Patient needs a postpartum colposcopy at [facility] after her 6 weeks evaluation at [facility]  Please have pt call and schedule!!   Document Released: 01/28/2000 Document Revised: 06/16/2013 Document Reviewed: 09/27/2011  [facility] Patient Information 2015 [place], [place]. This information is not intended to replace advice given to you by your health care provider. Make sure you discuss any questions you have with your health care provider.

## 2015-12-03 NOTE — Progress Notes (Signed)
D/C order from MD.  Reviewed d/c instructions and prescriptions with patient and answered any questions.  Patient d/c home with infant via wheelchair by nursing/auxillary. 

## 2015-12-03 NOTE — Progress Notes (Addendum)
Post Partum Day 2 Subjective: I want Depo before going home  Objective: Blood pressure 107/60, pulse 63, temperature 97.6 F (36.4 C), temperature source Oral, resp. rate 20, height 5\' 2"  (1.575 m), weight 149 lb (67.6 kg), last menstrual period 03/01/2015, SpO2 98 %, unknown if currently breastfeeding.  Physical Exam:  General: A,A&O x 3 Lungs: CTA bilat, no W/R/R Heart: S1S2, RRR, No M/R/G. Lochia: Mod, no clots Uterine Fundus: U-2. FF, lochia mod DVT Evaluation: Neg Homans Eating well, voiding well.    Recent Labs  11/30/15 2336 12/01/15 0845  HGB 12.8 12.3  HCT 37.2 35.6  WBC 9.1 12.4*  PLT 180 180    Assessment/Plan: 1. PPD#2 stable P: DC home 2. Flu shot today 3. Depo-Provera 150 mg IM prior to DC.  4. Pt [facility]copy [facility]   LOS: 3 days   Sharee Pimplearon W Gustie Bobb 12/03/2015, 8:06 AM

## 2016-12-19 DIAGNOSIS — E663 Overweight: Secondary | ICD-10-CM | POA: Insufficient documentation

## 2016-12-19 LAB — HM HIV SCREENING LAB: HM HIV Screening: NEGATIVE

## 2016-12-19 LAB — HM PAP SMEAR
HM Pap smear: NEGATIVE
HM Pap smear: NEGATIVE

## 2016-12-27 ENCOUNTER — Emergency Department: Payer: Medicaid Other

## 2016-12-27 ENCOUNTER — Emergency Department
Admission: EM | Admit: 2016-12-27 | Discharge: 2016-12-27 | Disposition: A | Discharge status: Home / Self Care | Payer: Medicaid Other | Attending: Student in an Organized Health Care Education/Training Program | Admitting: Student in an Organized Health Care Education/Training Program

## 2016-12-27 ENCOUNTER — Encounter: Payer: Self-pay | Admitting: Emergency Medicine

## 2016-12-27 DIAGNOSIS — O468X1 Other antepartum hemorrhage, first trimester: Secondary | ICD-10-CM | POA: Insufficient documentation

## 2016-12-27 DIAGNOSIS — Z3A01 Less than 8 weeks gestation of pregnancy: Secondary | ICD-10-CM | POA: Insufficient documentation

## 2016-12-27 DIAGNOSIS — O418X11 Other specified disorders of amniotic fluid and membranes, first trimester, fetus 1: Secondary | ICD-10-CM | POA: Insufficient documentation

## 2016-12-27 DIAGNOSIS — O209 Hemorrhage in early pregnancy, unspecified: Secondary | ICD-10-CM | POA: Diagnosis present

## 2016-12-27 DIAGNOSIS — O200 Threatened abortion: Secondary | ICD-10-CM | POA: Insufficient documentation

## 2016-12-27 LAB — HCG, QUANTITATIVE, PREGNANCY: hCG, Beta Chain, Quant, S: 16592 m[IU]/mL — ABNORMAL HIGH

## 2016-12-27 NOTE — ED Triage Notes (Signed)
Pt to ED with c/o of vaginal bleeding that started during the night. Pt is currently [redacted] weeks pregnant.

## 2016-12-27 NOTE — ED Notes (Addendum)
Pt states she is [redacted] weeks pregnant - pt reports that last night she had a large amount of vaginal bleeding with clear mucus - c/o lower abd cramping (she had "round ligament" pain with 4 prior pregnancies)

## 2016-12-27 NOTE — ED Provider Notes (Signed)
[facility] Emergency Department Provider Note    First MD Initiated Contact with Patient 12/27/16 1125     (approximate)  I have reviewed the triage vital signs and the nursing notes.   HISTORY  Chief Complaint No chief complaint on file.    HPI Carolyn Petersen is a 26 y.o. female who is roughly [redacted] weeks pregnant G4P3 who presents with mild to moderate crampy pelvic pain as well as vaginal bleeding that started this morning after she was straining to go to to.  Denies any fevers.  Denies any vaginal discharge other than the bleeding.  States that she did notice several clots and is worried that she is having a miscarriage.  Patient is RH positive.  History reviewed. No pertinent past medical history. History reviewed. No pertinent family history. History reviewed. No pertinent surgical history. Patient Active Problem List   Diagnosis Date Noted  . Uterine contractions during pregnancy 11/30/2015  . NST (non-stress test) nonreactive 11/19/2015  . Pregnancy affected by fetal growth restriction 11/19/2015  . Labor and delivery, indication for care 09/02/2015      Prior to Admission medications   Not on File    Allergies Patient has no known allergies.    Social History Social History   Tobacco Use  . Smoking status: Never Smoker  . Smokeless tobacco: Never Used  Substance Use Topics  . Alcohol use: No  . Drug use: No    Review of Systems Patient denies headaches, rhinorrhea, blurry vision, numbness, shortness of breath, chest pain, edema, cough, abdominal pain, nausea, vomiting, diarrhea, dysuria, fevers, rashes or hallucinations unless otherwise stated above in HPI. ____________________________________________   PHYSICAL EXAM:  VITAL SIGNS: Vitals:   12/27/16 0834  BP: (!) 113/50  Pulse: 76  Resp: 18  Temp: 98.6 F (37 C)  SpO2: 100%    Constitutional: Alert and oriented. Well appearing and in no acute distress. Eyes:  Conjunctivae are normal.  Head: Atraumatic. Nose: No congestion/rhinnorhea. Mouth/Throat: Mucous membranes are moist.   Neck: No stridor. Painless ROM.  Cardiovascular: Normal rate, regular rhythm. Grossly normal heart sounds.  Good peripheral circulation. Respiratory: Normal respiratory effort.  No retractions. Lungs CTAB. Gastrointestinal: Soft and nontender. No distention. No abdominal bruits. No CVA tenderness. Genitourinary: deferred Musculoskeletal: No lower extremity tenderness nor edema.  No joint effusions. Neurologic:  Normal speech and language. No gross focal neurologic deficits are appreciated. No facial droop Skin:  Skin is warm, dry and intact. No rash noted. Psychiatric: Mood and affect are normal. Speech and behavior are normal.  ____________________________________________   LABS (all labs ordered are listed, but only abnormal results are displayed)  Results for orders placed or performed during the hospital encounter of 12/27/16 (from the past 24 hour(s))  hCG, quantitative, pregnancy     Status: Abnormal   Collection Time: 12/27/16  8:47 AM  Result Value Ref Range   hCG, Beta Chain, Quant, S 16,592 (H) <5 mIU/mL   ____________________________________________ ____________________________________________  RADIOLOGY   ____________________________________________   PROCEDURES  Procedure(s) performed:  Procedures    Critical Care performed: no ____________________________________________   INITIAL IMPRESSION / ASSESSMENT AND PLAN / ED COURSE  Pertinent labs & imaging results that were available during my care of the patient were reviewed by me and considered in my medical decision making (see chart for details).  DDX: ectopic, miscarriage, subchorionic hematoma, abruption  Carolyn Petersen is a 26 y.o. who presents to the ED with bleeding in first trimester with crampy pelvic  pain.  Ultrasound ordered for the above differential shows no evidence of  ectopic or abruption.  She does have small subchorionic viable and reassuring assessment of the fetus.  She is Rh+.  Patient is stable and appropriate for outpatient follow-up.      ____________________________________________   FINAL CLINICAL IMPRESSION(S) / ED DIAGNOSES  Final diagnoses:  Threatened miscarriage  Subchorionic hematoma in first trimester, fetus 1 of multiple gestation      NEW MEDICATIONS STARTED DURING THIS VISIT:  This SmartLink is deprecated. Use AVSMEDLIST instead to display the medication list for a patient.   Note:  This document was prepared using Dragon voice recognition software and may include unintentional dictation errors.    Willy Eddyobinson, Johnanna Bakke, MD 12/27/16 1254

## 2016-12-28 NOTE — Progress Notes (Signed)
Obstetric Problem Visit    Chief Complaint:  Chief Complaint  Patient presents with  . first trimester bleeding    ER follow-up    History of Present Illness: Patient is a 26 y.o. [id number] at 4411w1d by ED ultrasound on 12/27/2016 presenting for first trimester bleeding.  The onset of bleeding was 2 days ago and has been decreasing.  Initially heavy enough to run down her leg, now just light brown spotting.  Ultrasound 2 days ago with viable IUP 6 weeks 6 days.  Is bleeding equal to or greater than normal menstrual flow:  No Any recent trauma:  No Recent intercourse:  No History of prior miscarriage:  No Prior ultrasound demonstrating IUP:  Yes Prior ultrasound demonstrating viable IUP:  Yes Prior Serum HCG:  Yes Rh status: O positive  Review of Systems: ROS  Past Medical History:  History reviewed. No pertinent past medical history.  Past Surgical History:  History reviewed. No pertinent surgical history.  Gynecologic History:  No LMP recorded. Contraception: none Last Pap: 12/27/16. Results are pending at [facility]  Obstetric History: [id number]  Family History:  History reviewed. No pertinent family history.  Social History:  Social History   Socioeconomic History  . Marital status: Single    Spouse name: Not on file  . Number of children: Not on file  . Years of education: Not on file  . Highest education level: Not on file  Social Needs  . Financial resource strain: Not on file  . Food insecurity - worry: Not on file  . Food insecurity - inability: Not on file  . Transportation needs - medical: Not on file  . Transportation needs - non-medical: Not on file  Occupational History  . Not on file  Tobacco Use  . Smoking status: Never Smoker  . Smokeless tobacco: Never Used  Substance and Sexual Activity  . Alcohol use: No  . Drug use: No  . Sexual activity: Yes    Birth control/protection: None  Other Topics Concern  . Not on file  Social History Narrative    . Not on file    Allergies:  No Known Allergies  Medications: Prior to Admission medications   Not on File    Physical Exam Vitals: unknown if currently breastfeeding. General: NAD HEENT: normocephalic, anicteric Neck: Thyroid non-enlarged, no nodules Pulmonary: No increased work of breathing, Abdomen: NABS, soft, non-tender, non-distended.  Umbilicus without lesions.  No hepatomegaly, splenomegaly or masses palpable. No evidence of hernia  Genitourinary:  External: Normal external female genitalia.  Normal urethral meatus, normal  Bartholin's and Skene's glands.    Vagina: Normal vaginal mucosa, no evidence of prolapse.    Cervix: non-enlarged, parous, closed, scant old blood at os  Uterus: non-tender, non-enlarged  Adnexa: ovaries non-enlarged, no adnexal masses  Rectal: deferred Extremities: no edema, erythema, or tenderness Neurologic: Grossly intact Psychiatric: mood appropriate, affect full  Results for orders placed or performed during the hospital encounter of 12/27/16 (from the past 72 hour(s))  hCG, quantitative, pregnancy     Status: Abnormal   Collection Time: 12/27/16  8:47 AM  Result Value Ref Range   hCG, Beta Chain, Quant, S 16,592 (H) <5 mIU/mL    Comment:          GEST. AGE      CONC.  (mIU/mL)   <=1 WEEK        5 - 50     2 WEEKS       50 - 500  3 WEEKS       100 - 10,000     4 WEEKS     1,000 - 30,000     5 WEEKS     3,500 - 115,000   6-8 WEEKS     12,000 - 270,000    12 WEEKS     15,000 - 220,000        FEMALE AND NON-PREGNANT FEMALE:     LESS THAN 5 mIU/mL    US Ob Comp Less 14 Wks  Result Date: 12/27/2016 CLINICAL DATA:  26 year old female with vaginal bleeding in the first trimester of pregnancy. Quantitative beta HCG 16,592. Estimated gestational age by LMP 6 weeks 6 days. EXAM: OBSTETRIC <14 WK [place] AND TRANSVAGINAL OB US TECHNIQUE: Both transabdominal and transvaginal ultrasound examinations were performed for complete evaluation of the  gestation as well as the maternal uterus, adnexal regions, and pelvic cul-de-sac. Transvaginal technique was performed to assess early pregnancy. COMPARISON:  None relevant FINDINGS: Intrauterine gestational sac: Single Yolk sac:  Visible Embryo:  Visible Cardiac Activity: Detected Heart Rate: 117  bpm CRL:  6  mm   6 w   3 d                  [place] EDC: 08/19/2017 Subchorionic hemorrhage: Moderate volume (images 69, and 72) including suspected hemorrhage in the endometrium between the gestational sac and cervix. Maternal uterus/adnexae: Normal right ovary 3.5 x 2.0 x 3.9 cm. The left ovary might contain the corpus luteum (image 102). The left ovary is 3.3 x 5.0 x 2.4 cm. No pelvic free fluid. IMPRESSION: Single living IUP demonstrated with Moderate volume of subchorionic hemorrhage, but no other acute findings identified. Electronically Signed   By: Odessa Fleming M.D.   On: 12/27/2016 12:48   US Ob Transvaginal  Result Date: 12/27/2016 CLINICAL DATA:  26 year old female with vaginal bleeding in the first trimester of pregnancy. Quantitative beta HCG 16,592. Estimated gestational age by LMP 6 weeks 6 days. EXAM: OBSTETRIC <14 WK [place] AND TRANSVAGINAL OB US TECHNIQUE: Both transabdominal and transvaginal ultrasound examinations were performed for complete evaluation of the gestation as well as the maternal uterus, adnexal regions, and pelvic cul-de-sac. Transvaginal technique was performed to assess early pregnancy. COMPARISON:  None relevant FINDINGS: Intrauterine gestational sac: Single Yolk sac:  Visible Embryo:  Visible Cardiac Activity: Detected Heart Rate: 117  bpm CRL:  6  mm   6 w   3 d                  [place] EDC: 08/19/2017 Subchorionic hemorrhage: Moderate volume (images 69, and 72) including suspected hemorrhage in the endometrium between the gestational sac and cervix. Maternal uterus/adnexae: Normal right ovary 3.5 x 2.0 x 3.9 cm. The left ovary might contain the corpus luteum (image 102). The left ovary is 3.3 x  5.0 x 2.4 cm. No pelvic free fluid. IMPRESSION: Single living IUP demonstrated with Moderate volume of subchorionic hemorrhage, but no other acute findings identified. Electronically Signed   By: Odessa Fleming M.D.   On: 12/27/2016 12:48   Assessment: 26 y.o. [id number] Unknown presenting for evaluation of first trimester vaginal bleeding  Plan: Problem List Items Addressed This Visit    None    Visit Diagnoses    Threatened abortion    -  Primary   Relevant Orders   US OB Comp Less 14 Wks   First trimester bleeding       Relevant Orders   US  OB Comp Less 14 Wks   [redacted] weeks gestation of pregnancy       Relevant Orders   US OB Comp Less 14 Wks      1) First trimester bleeding - incidence and clinical course of first trimester bleeding is discussed in detail with the patient today.  Approximately 1/3 of pregnancies ending in live births experienced 1st trimester bleeding.  The amount of bleeding is variable and not necessarily predictive of outcome.  Sources may be cervical or uterine.  Subchorionic hemorrhages are a frequent concurrent findings on ultrasound and are followed expectantly.  These often absorb or regress spontaneously although risk for expansion and further disruption of the utero-placental interface leading to miscarriage is possible.  There is no clearly documented benefit to limiting or modifying activity and sexual intercourse in altering clinic course of 1st trimester bleeding.    2) Given demonstration of moderate size subchorionic hemorrhage on ER ultrasound 12/27/2016 will obtain follow up ultrasound to document continued viability in the next week along with NOB  3) The patient is Rh positive, rhogam is therefore not indicated to decrease the risk rhesus alloimmunization.    4) Routine bleeding precautions were discussed with the patient prior the conclusion of today's visit.  5) History of abnormal paps and non-compliance with follow up - LSIL 05/13/15 at [facility], did not show  for colpo x 2 last pregnancy.  08/11/13 LSIL, 02/13/12 LSIL, 05/11/10 ASCUS HPV + - pap, GC&CT done 12/27/16 at [facility] - verify remaining labs also drawn  6) A total of 20 minutes were spent in face-to-face contact with the patient during this encounter with over half of that time devoted to counseling and coordination of care.  Vena AustriaAndreas Verdell Kincannon, MD, Merlinda FrederickFACOG [facility], [facility]

## 2016-12-29 ENCOUNTER — Encounter: Payer: Self-pay | Admitting: Obstetrics and Gynecology

## 2016-12-29 ENCOUNTER — Ambulatory Visit (INDEPENDENT_AMBULATORY_CARE_PROVIDER_SITE_OTHER): Payer: Medicaid Other | Admitting: Obstetrics and Gynecology

## 2016-12-29 VITALS — BP 110/60 | HR 83 | Ht 62.0 in | Wt 138.0 lb

## 2016-12-29 DIAGNOSIS — O200 Threatened abortion: Secondary | ICD-10-CM | POA: Diagnosis not present

## 2016-12-29 DIAGNOSIS — Z3A01 Less than 8 weeks gestation of pregnancy: Secondary | ICD-10-CM | POA: Diagnosis not present

## 2016-12-29 DIAGNOSIS — O209 Hemorrhage in early pregnancy, unspecified: Secondary | ICD-10-CM | POA: Diagnosis not present

## 2016-12-29 NOTE — Patient Instructions (Signed)
Vaginal Bleeding During Pregnancy, First Trimester °A small amount of bleeding (spotting) from the vagina is common in early pregnancy. Sometimes the bleeding is normal and is not a problem, and sometimes it is a sign of something serious. Be sure to tell your doctor about any bleeding from your vagina right away. °Follow these instructions at home: °· Watch your condition for any changes. °· Follow your doctor's instructions about how active you can be. °· If you are on bed rest: °? You may need to stay in bed and only get up to use the bathroom. °? You may be allowed to do some activities. °? If you need help, make plans for someone to help you. °· Write down: °? The number of pads you use each day. °? How often you change pads. °? How soaked (saturated) your pads are. °· Do not use tampons. °· Do not douche. °· Do not have sex or orgasms until your doctor says it is okay. °· If you pass any tissue from your vagina, save the tissue so you can show it to your doctor. °· Only take medicines as told by your doctor. °· Do not take aspirin because it can make you bleed. °· Keep all follow-up visits as told by your doctor. °Contact a doctor if: °· You bleed from your vagina. °· You have cramps. °· You have labor pains. °· You have a fever that does not go away after you take medicine. °Get help right away if: °· You have very bad cramps in your back or belly (abdomen). °· You pass large clots or tissue from your vagina. °· You bleed more. °· You feel light-headed or weak. °· You pass out (faint). °· You have chills. °· You are leaking fluid or have a gush of fluid from your vagina. °· You pass out while pooping (having a bowel movement). °This information is not intended to replace advice given to you by your health care provider. Make sure you discuss any questions you have with your health care provider. °Document Released: 06/16/2013 Document Revised: 07/08/2015 Document Reviewed: 10/07/2012 °Elsevier Interactive  Patient Education © 2018 [employer] ° °

## 2017-01-08 ENCOUNTER — Other Ambulatory Visit: Payer: Medicaid Other

## 2017-01-08 ENCOUNTER — Encounter: Payer: Medicaid Other | Admitting: Obstetrics and Gynecology

## 2017-01-11 ENCOUNTER — Encounter: Payer: Medicaid Other | Admitting: Maternal Newborn

## 2017-01-11 ENCOUNTER — Other Ambulatory Visit: Payer: Medicaid Other

## 2017-01-26 ENCOUNTER — Emergency Department: Payer: Medicaid Other

## 2017-01-26 ENCOUNTER — Encounter: Payer: Self-pay | Admitting: Emergency Medicine

## 2017-01-26 ENCOUNTER — Emergency Department
Admission: EM | Admit: 2017-01-26 | Discharge: 2017-01-26 | Disposition: A | Discharge status: Home / Self Care | Payer: Medicaid Other | Attending: Emergency Medicine | Admitting: Emergency Medicine

## 2017-01-26 DIAGNOSIS — O209 Hemorrhage in early pregnancy, unspecified: Secondary | ICD-10-CM | POA: Diagnosis present

## 2017-01-26 DIAGNOSIS — O208 Other hemorrhage in early pregnancy: Secondary | ICD-10-CM | POA: Insufficient documentation

## 2017-01-26 DIAGNOSIS — N939 Abnormal uterine and vaginal bleeding, unspecified: Secondary | ICD-10-CM

## 2017-01-26 DIAGNOSIS — R1030 Lower abdominal pain, unspecified: Secondary | ICD-10-CM | POA: Insufficient documentation

## 2017-01-26 DIAGNOSIS — O418X10 Other specified disorders of amniotic fluid and membranes, first trimester, not applicable or unspecified: Secondary | ICD-10-CM

## 2017-01-26 DIAGNOSIS — Z3A10 10 weeks gestation of pregnancy: Secondary | ICD-10-CM | POA: Insufficient documentation

## 2017-01-26 DIAGNOSIS — O468X1 Other antepartum hemorrhage, first trimester: Secondary | ICD-10-CM

## 2017-01-26 DIAGNOSIS — O4690 Antepartum hemorrhage, unspecified, unspecified trimester: Secondary | ICD-10-CM

## 2017-01-26 LAB — CBC WITH DIFFERENTIAL/PLATELET
Basophils Absolute: 0 K/uL (ref 0–0.1)
Basophils Relative: 1 %
Eosinophils Absolute: 0.4 K/uL (ref 0–0.7)
Eosinophils Relative: 9 %
HCT: 39.5 % (ref 35.0–47.0)
Hemoglobin: 13.7 g/dL (ref 12.0–16.0)
Lymphocytes Relative: 24 %
Lymphs Abs: 1.2 K/uL (ref 1.0–3.6)
MCH: 31.4 pg (ref 26.0–34.0)
MCHC: 34.7 g/dL (ref 32.0–36.0)
MCV: 90.6 fL (ref 80.0–100.0)
Monocytes Absolute: 0.5 K/uL (ref 0.2–0.9)
Monocytes Relative: 9 %
Neutro Abs: 2.9 K/uL (ref 1.4–6.5)
Neutrophils Relative %: 57 %
Platelets: 244 K/uL (ref 150–440)
RBC: 4.36 MIL/uL (ref 3.80–5.20)
RDW: 13.9 % (ref 11.5–14.5)
WBC: 5.1 K/uL (ref 3.6–11.0)

## 2017-01-26 LAB — HCG, QUANTITATIVE, PREGNANCY: hCG, Beta Chain, Quant, S: 88570 m[IU]/mL — ABNORMAL HIGH (ref <5)

## 2017-01-26 LAB — ABO/RH: ABO/RH(D): O POS

## 2017-01-26 NOTE — ED Provider Notes (Signed)
[facility] Emergency Department Provider Note  ____________________________________________  Time seen: Approximately 2:46 PM  I have reviewed the triage vital signs and the nursing notes.   HISTORY  Chief Complaint Vaginal Bleeding   HPI Carolyn Petersen is a 26 y.o. female G5P4 currently at [redacted] weeks GA who presents for vaginal bleeding. Patient reports that this started last night. No clots. She reports that the bleeding is as heavy as a menstrual period. She is also having mild lower abdominal cramping that has been intermittent since yesterday. No syncope.  History reviewed. No pertinent past medical history.  Patient Active Problem List   Diagnosis Date Noted  . Uterine contractions during pregnancy 11/30/2015  . NST (non-stress test) nonreactive 11/19/2015  . Pregnancy affected by fetal growth restriction 11/19/2015  . Labor and delivery, indication for care 09/02/2015    History reviewed. No pertinent surgical history.  Prior to Admission medications   Not on File    Allergies Patient has no known allergies.  History reviewed. No pertinent family history.  Social History Social History   Tobacco Use  . Smoking status: Never Smoker  . Smokeless tobacco: Never Used  Substance Use Topics  . Alcohol use: No  . Drug use: No    Review of Systems  Constitutional: Negative for fever. Eyes: Negative for visual changes. ENT: Negative for sore throat. Neck: No neck pain  Cardiovascular: Negative for chest pain. Respiratory: Negative for shortness of breath. Gastrointestinal: + lower abdominal cramping. No vomiting or diarrhea. Genitourinary: Negative for dysuria. + vaginal bleeding Musculoskeletal: Negative for back pain. Skin: Negative for rash. Neurological: Negative for headaches, weakness or numbness. Psych: No SI or HI  ____________________________________________   PHYSICAL EXAM:  VITAL SIGNS: ED Triage Vitals  Enc  Vitals Group     BP 01/26/17 1340 120/78     Pulse Rate 01/26/17 1340 67     Resp 01/26/17 1340 18     Temp 01/26/17 1340 98.8 F (37.1 C)     Temp Source 01/26/17 1340 Oral     SpO2 01/26/17 1340 100 %     Weight 01/26/17 1316 147 lb (66.7 kg)     Height 01/26/17 1341 5\' 2"  (1.575 m)     Head Circumference --      Peak Flow --      Pain Score 01/26/17 1315 5     Pain Loc --      Pain Edu? --      Excl. in GC? --     Constitutional: Alert and oriented. Well appearing and in no apparent distress. HEENT:      Head: Normocephalic and atraumatic.         Eyes: Conjunctivae are normal. Sclera is non-icteric.       Mouth/Throat: Mucous membranes are moist.       Neck: Supple with no signs of meningismus. Cardiovascular: Regular rate and rhythm. No murmurs, gallops, or rubs. 2+ symmetrical distal pulses are present in all extremities. No JVD. Respiratory: Normal respiratory effort. Lungs are clear to auscultation bilaterally. No wheezes, crackles, or rhonchi.  Gastrointestinal: Soft, non tender, and non distended with positive bowel sounds. No rebound or guarding. Musculoskeletal: Nontender with normal range of motion in all extremities. No edema, cyanosis, or erythema of extremities. Neurologic: Normal speech and language. Face is symmetric. Moving all extremities. No gross focal neurologic deficits are appreciated. Skin: Skin is warm, dry and intact. No rash noted. Psychiatric: Mood and affect are normal. Speech and  behavior are normal.  ____________________________________________   LABS (all labs ordered are listed, but only abnormal results are displayed)  Labs Reviewed  HCG, QUANTITATIVE, PREGNANCY - Abnormal; Notable for the following components:      Result Value   hCG, Beta Chain, Quant, S 88,570 (*)    All other components within normal limits  CBC WITH DIFFERENTIAL/PLATELET  ABO/RH   ____________________________________________  EKG  none    ____________________________________________  RADIOLOGY  TVUS: Single live intrauterine pregnancy. Calculated gestational age is 10 weeks and 6 days.  Moderate subchorionic hemorrhage. ____________________________________________   PROCEDURES  Procedure(s) performed: None Procedures Critical Care performed:  None ____________________________________________   INITIAL IMPRESSION / ASSESSMENT AND PLAN / ED COURSE   26 y.o. female G5P4 currently at [redacted] weeks GA who presents for vaginal bleeding.patient is hemodynamically stable, well appearing, abdomen with no tenderness throughout. Differential diagnoses including lifting her first trimester versus a threatened miscarriage. Patient already had a ultrasound this pregnancy a month ago that confirmed an IUP. We'll send patient for a limited ultrasound to evaluate for evidence of miscarriage. beta Quant elevated appropriately at 88K. Hgb stable. blood type O-positive with no indication for RhoGAM    _________________________ 4:05 PM on 01/26/2017 -----------------------------------------  ultrasound consistent with a moderate subchorionic hemorrhage. Normal single live IUP at 10 weeks and 6 days. Patient remains hemodynamically stable with no further bleeding the emergency room. Discussed pelvic rest and close follow-up with OB/GYN.   As part of my medical decision making, I reviewed the following data within the electronic MEDICAL RECORD NUMBER Nursing notes reviewed and incorporated, Labs reviewed , Old chart reviewed, Radiograph reviewed , Notes from prior ED visits and [place] Controlled Substance Database    Pertinent labs & imaging results that were available during my care of the patient were reviewed by me and considered in my medical decision making (see chart for details).    ____________________________________________   FINAL CLINICAL IMPRESSION(S) / ED DIAGNOSES  Final diagnoses:  Vaginal bleeding in pregnancy  Subchorionic  hematoma in first trimester, single or unspecified fetus      NEW MEDICATIONS STARTED DURING THIS VISIT:  ED Discharge Orders    None       Note:  This document was prepared using Dragon voice recognition software and may include unintentional dictation errors.    Nita SickleVeronese, Williams, MD 01/26/17 [phone]

## 2017-01-26 NOTE — ED Notes (Signed)
topez not working 

## 2017-01-26 NOTE — ED Triage Notes (Signed)
Pt to ed with c/o vaginal bleeding and cramping that started last night.  Pt states she is approx [redacted] weeks pregnant.

## 2017-02-08 ENCOUNTER — Ambulatory Visit (INDEPENDENT_AMBULATORY_CARE_PROVIDER_SITE_OTHER): Payer: Medicaid Other | Admitting: Obstetrics and Gynecology

## 2017-02-08 ENCOUNTER — Encounter: Payer: Self-pay | Admitting: Obstetrics and Gynecology

## 2017-02-08 VITALS — BP 106/80 | HR 92 | Ht 62.0 in | Wt 140.0 lb

## 2017-02-08 DIAGNOSIS — Z6790 Unspecified blood type, Rh positive: Secondary | ICD-10-CM

## 2017-02-08 DIAGNOSIS — O200 Threatened abortion: Secondary | ICD-10-CM

## 2017-02-08 DIAGNOSIS — Z1379 Encounter for other screening for genetic and chromosomal anomalies: Secondary | ICD-10-CM

## 2017-02-08 NOTE — Progress Notes (Signed)
Obstetric Problem Visit    Chief Complaint:  Chief Complaint  Patient presents with  . ER follow up    Early OB bleeding no cramping    History of Present Illness: Patient is a 26 y.o. [id number] Unknown presenting for first trimester bleeding.  The onset of bleeding was 12/27/2016, she as noted to have a moderate size subchorionic hemorrhage at that time.  She did not make a follow up appointment and had a repeat episode of bleeding on 01/26/2017 once again demonstrating a viable IUP and moderate subchorionic hemorrhage.  [facility] on initial 6268w3d US on 12/27/16.  Is bleeding equal to or greater than normal menstrual flow:  Yes Any recent trauma:  No Recent intercourse:  No History of prior miscarriage:  No Prior ultrasound demonstrating IUP:  Yes Prior ultrasound demonstrating viable IUP:  Yes Prior Serum HCG:  Yes Rh status: O positive  Review of Systems: Review of Systems  Constitutional: Negative for chills and fever.  Gastrointestinal: Positive for nausea. Negative for abdominal pain and vomiting.  Neurological: Positive for dizziness.    Past Medical History:  No past medical history on file.  Past Surgical History:  No past surgical history on file.  Gynecologic History:  Patient's last menstrual period was 11/09/2016.  Obstetric History: [id number]  Family History:  No family history on file.  Social History:  Social History   Socioeconomic History  . Marital status: Single    Spouse name: Not on file  . Number of children: Not on file  . Years of education: Not on file  . Highest education level: Not on file  Social Needs  . Financial resource strain: Not on file  . Food insecurity - worry: Not on file  . Food insecurity - inability: Not on file  . Transportation needs - medical: Not on file  . Transportation needs - non-medical: Not on file  Occupational History  . Not on file  Tobacco Use  . Smoking status: Never Smoker  . Smokeless  tobacco: Never Used  Substance and Sexual Activity  . Alcohol use: No  . Drug use: No  . Sexual activity: Yes    Birth control/protection: None  Other Topics Concern  . Not on file  Social History Narrative  . Not on file    Allergies:  No Known Allergies  Medications: Prior to Admission medications   Not on File    Physical Exam Vitals: Blood pressure 106/80, pulse 92, height 5\' 2"  (1.575 m), weight 140 lb (63.5 kg), last menstrual period 11/09/2016, unknown if currently breastfeeding. FHT 155 today  General: NAD HEENT: normocephalic, anicteric Pulmonary: No increased work of breathing, Abdomen: NABS, soft, non-tender, non-distended.  Umbilicus without lesions.  No hepatomegaly, splenomegaly or  Neurologic: Grossly intact Psychiatric: mood appropriate, affect full  Assessment: 26 y.o. [id number] Unknown presenting for evaluation of first trimester vaginal bleeding  Plan: Problem List Items Addressed This Visit    None    Visit Diagnoses    Encounter for genetic screening for Down Syndrome    -  Primary   Relevant Orders   US Fetal Nuchal Translucency Measurement      1) First trimester bleeding - incidence and clinical course of first trimester bleeding is discussed in detail with the patient today.  Approximately 1/3 of pregnancies ending in live births experienced 1st trimester bleeding.  The amount of bleeding is variable and not necessarily predictive of outcome.  Sources may be cervical or uterine.  Subchorionic  hemorrhages are a frequent concurrent findings on ultrasound and are followed expectantly.  These often absorb or regress spontaneously although risk for expansion and further disruption of the utero-placental interface leading to miscarriage is possible.  There is no clearly documented benefit to limiting or modifying activity and sexual intercourse in altering clinic course of 1st trimester bleeding.    2) If no already done will proceed with TVUS evaluation  to document viability, and if uncertain viability or absence of a demonstrable IUP (and no previous documentation of IUP) will trend HCG levels.  3) The patient is Rh postive, rhogam is therefore not indicated to decrease the risk rhesus alloimmunization.    4) Routine bleeding precautions were discussed with the patient prior the conclusion of today's visit.  6) NOB rescheduled for next week  7) A total of 15 minutes were spent in face-to-face contact with the patient during this encounter with over half of that time devoted to counseling and coordination of care.   Vena AustriaAndreas Kalysta Kneisley, MD, Merlinda FrederickFACOG [facility], [facility]

## 2017-02-16 ENCOUNTER — Encounter: Payer: Self-pay | Admitting: Advanced Practice Midwife

## 2017-02-16 ENCOUNTER — Ambulatory Visit (INDEPENDENT_AMBULATORY_CARE_PROVIDER_SITE_OTHER): Payer: Medicaid Other | Admitting: Advanced Practice Midwife

## 2017-02-16 ENCOUNTER — Ambulatory Visit (INDEPENDENT_AMBULATORY_CARE_PROVIDER_SITE_OTHER): Payer: Medicaid Other

## 2017-02-16 VITALS — BP 118/78 | Wt 140.0 lb

## 2017-02-16 DIAGNOSIS — Z1379 Encounter for other screening for genetic and chromosomal anomalies: Secondary | ICD-10-CM

## 2017-02-16 DIAGNOSIS — Z124 Encounter for screening for malignant neoplasm of cervix: Secondary | ICD-10-CM

## 2017-02-16 DIAGNOSIS — Z113 Encounter for screening for infections with a predominantly sexual mode of transmission: Secondary | ICD-10-CM

## 2017-02-16 DIAGNOSIS — Z3480 Encounter for supervision of other normal pregnancy, unspecified trimester: Secondary | ICD-10-CM

## 2017-02-16 LAB — OB RESULTS CONSOLE VARICELLA ZOSTER ANTIBODY, IGG: Varicella: IMMUNE

## 2017-02-16 NOTE — Progress Notes (Signed)
NT screen today. No vb. No lof.  

## 2017-02-16 NOTE — Patient Instructions (Signed)

## 2017-02-16 NOTE — Progress Notes (Signed)
New Obstetric Patient H&P    Chief Complaint: "Desires prenatal care"    History of Present Illness: Patient is a 27 y.o. [id number] Not Hispanic or Latino female, LMP 11/12/2016 presents with amenorrhea and positive home pregnancy test. Based on her  LMP, her EDD is Estimated Date of Delivery: 08/19/17 and her EGA is 6664w5d. Cycles are 5. days, irregular, and occur approximately every : 3-5 weeks. Her last pap smear was about 2 years ago and was LGSIL suspicious for HGSIL. She did not keep her appointments for colposcopy in March/April of 2017. She has not yet followed up on her last PAP smear.    She had a urine pregnancy test which was positive about 2 month(s) ago. She had planned to follow up with her Ob [facility] and then transferred here. Her last menstrual period was normal and lasted for  4 or 5 day(s). Since her LMP she claims she has experienced breast tenderness, fatigue, nausea. Her past medical history is noncontributory. Her prior pregnancies are notable for IUGR with last pregnancy with delivery at 3457w2d on 12/01/2015. She has had some vaginal bleeding with this pregnancy most likely due to sub chorionic hemorrhage. Most recently she had dark brown spotting this past weekend.   Since her LMP, she admits to the use of tobacco products  Yes she smokes 3 cigarettes per day She claims she has lost  5 pounds since the start of her pregnancy.  There are cats in the home in the home  no  She admits close contact with children on a regular basis  yes  She has had chicken pox in the past unknown She has had Tuberculosis exposures, symptoms, or previously tested positive for TB   no Current or past history of domestic violence. no  Genetic Screening/Teratology Counseling: (Includes patient, baby's father, or anyone in either family with:)   1. [facility]/= 2535 [facility]  no 2. Thalassemia ([place], [place], Mediterranean, or Asian background): MCV<80  no 3. Neural tube  defect (meningomyelocele, spina bifida, anencephaly)  no 4. Congenital heart defect  no  5. Down syndrome  no 6. Tay-Sachs (Jewish, [place])  no 7. Canavan's Disease  no 8. Sickle cell disease or trait (African)  no  9. Hemophilia or other blood disorders  no  10. Muscular dystrophy  no  11. Cystic fibrosis  no  12. Huntington's Chorea  no  13. Mental retardation/autism  no 14. Other inherited genetic or chromosomal disorder  no 15. Maternal metabolic disorder (DM, PKU, etc)  no 16. Patient or FOB with a child with a birth defect not listed above no  16a. Patient or FOB with a birth defect themselves no 17. Recurrent pregnancy loss, or stillbirth  no  18. Any medications since LMP other than prenatal vitamins (include vitamins, supplements, OTC meds, drugs, alcohol)  no 19. Any other genetic/environmental exposure to discuss  no  Infection History:   1. Lives with someone with TB or TB exposed  no  2. Patient or partner has history of genital herpes  no 3. Rash or viral illness since LMP  no 4. History of STI (GC, CT, HPV, syphilis, HIV)  yes 5. History of recent travel :  no  Other pertinent information:  no    Review of Systems:10 point review of systems negative unless otherwise noted in HPI  Past Medical History:  History reviewed. No pertinent past medical history.  Past Surgical History:  History reviewed.  No pertinent surgical history.  Gynecologic History: Patient's last menstrual period was 11/12/2016.  Obstetric History: [id number]  Family History:  History reviewed. No pertinent family history.  Social History:  Social History   Socioeconomic History  . Marital status: Single    Spouse name: Not on file  . Number of children: Not on file  . Years of education: Not on file  . Highest education level: Not on file  Social Needs  . Financial resource strain: Not on file  . Food insecurity - worry: Not on file  . Food insecurity - inability: Not on  file  . Transportation needs - medical: Not on file  . Transportation needs - non-medical: Not on file  Occupational History  . Not on file  Tobacco Use  . Smoking status: Never Smoker  . Smokeless tobacco: Never Used  Substance and Sexual Activity  . Alcohol use: No  . Drug use: No  . Sexual activity: Yes    Birth control/protection: None  Other Topics Concern  . Not on file  Social History Narrative  . Not on file    Allergies:  No Known Allergies  Medications: Prior to Admission medications   Not on File    Physical Exam Vitals: Blood pressure 118/78, weight 140 lb (63.5 kg), last menstrual period 11/12/2016  General: NAD HEENT: normocephalic, anicteric Thyroid: no enlargement, no palpable nodules Pulmonary: No increased work of breathing, CTAB Cardiovascular: RRR, distal pulses 2+ Abdomen: NABS, soft, non-tender, non-distended.  Umbilicus without lesions.  No hepatomegaly, splenomegaly or masses palpable. No evidence of hernia  Genitourinary:  External: Normal external female genitalia.  Normal urethral meatus, normal  Bartholin's and Skene's glands.    Vagina: Normal vaginal mucosa, no evidence of prolapse.    Cervix: Grossly normal in appearance, no bleeding, no CMT  Uterus: Enlarged, mobile, normal contour.    Adnexa: ovaries non-enlarged, no adnexal masses  Rectal: deferred Extremities: no edema, erythema, or tenderness Neurologic: Grossly intact Psychiatric: mood appropriate, affect full  NT scan today for 1st trimester screen is WNL. Fetal heart rate is 153 bpm. Fetal size agrees with dates.   Assessment: 26 y.o. [name] at [redacted]w[redacted]d presenting to initiate prenatal care  Plan: 1) Avoid alcoholic beverages. 2) Patient encouraged not to smoke.  3) Discontinue the use of all non-medicinal drugs and chemicals.  4) Take prenatal vitamins daily.  5) Nutrition, food safety (fish, cheese advisories, and high nitrite foods) and exercise discussed. 6) Hospital  and practice style discussed with cross coverage system.  7) Genetic Screening, such as with 1st Trimester Screening, cell free fetal DNA, AFP testing, and Ultrasound, as well as with amniocentesis and CVS as appropriate, is discussed with patient. At the conclusion of today's visit patient requested genetic testing 8) Patient is asked about travel to areas at risk for the Zika virus, and counseled to avoid travel and exposure to mosquitoes or sexual partners who may have themselves been exposed to the virus. Testing is discussed, and will be ordered as appropriate.  9) NOB lab work today  Tresea Mall, CNM

## 2017-02-18 LAB — URINE CULTURE: Organism ID, Bacteria: NO GROWTH

## 2017-02-19 LAB — RPR+RH+ABO+RUB AB+AB SCR+CB...
ANTIBODY SCREEN: NEGATIVE
HEMOGLOBIN: 12.4 g/dL (ref 11.1–15.9)
HIV Screen 4th Generation wRfx: NONREACTIVE
Hematocrit: 37 % (ref 34.0–46.6)
Hepatitis B Surface Ag: NEGATIVE
MCH: 29.9 pg (ref 26.6–33.0)
MCHC: 33.5 g/dL (ref 31.5–35.7)
MCV: 89 fL (ref 79–97)
Platelets: 230 10*3/uL (ref 150–379)
RBC: 4.15 x10E6/uL (ref 3.77–5.28)
RDW: 13.7 % (ref 12.3–15.4)
RPR Ser Ql: NONREACTIVE
RUBELLA: 3.19 {index} (ref >0.99)
Rh Factor: POSITIVE
Varicella zoster IgG: 435 index (ref >165)
WBC: 6.7 10*3/uL (ref 3.4–10.8)

## 2017-02-19 LAB — HEMOGLOBINOPATHY EVALUATION
HEMOGLOBIN F QUANTITATION: 0 % (ref 0.0–2.0)
HGB C: 0 %
HGB S: 0 %
HGB VARIANT: 0 %
Hemoglobin A2 Quantitation: 2.7 % (ref 1.8–3.2)
Hgb A: 97.3 % (ref 96.4–98.8)

## 2017-02-20 LAB — FIRST TRIMESTER SCREEN W/NT
CRL: 81.1 mm
DIA MoM: 0.81
DIA Value: 181 pg/mL
GEST AGE-COLLECT: 13.4 wk
HCG VALUE: 44.5 [IU]/mL
Maternal Age At EDD: 28.1 yr
NUCHAL TRANSLUCENCY MOM: 0.81
NUCHAL TRANSLUCENCY: 1.6 mm
Number of Fetuses: 1
PAPP-A MoM: 0.59
PAPP-A VALUE: 878.3 ng/mL
TEST RESULTS: NEGATIVE
Weight: 140 [lb_av]
hCG MoM: 0.55

## 2017-02-23 LAB — IGP,CTNGTV,RFX APTIMA HPV ASCU
CHLAMYDIA, NUC. ACID AMP: NEGATIVE
GONOCOCCUS, NUC. ACID AMP: NEGATIVE
PAP Smear Comment: 0
Trich vag by NAA: NEGATIVE

## 2017-02-23 LAB — HPV APTIMA: HPV APTIMA: POSITIVE — AB

## 2017-03-26 ENCOUNTER — Other Ambulatory Visit: Payer: Self-pay | Admitting: Maternal Newborn

## 2017-03-26 ENCOUNTER — Encounter: Payer: Self-pay | Admitting: Maternal Newborn

## 2017-03-26 ENCOUNTER — Ambulatory Visit (INDEPENDENT_AMBULATORY_CARE_PROVIDER_SITE_OTHER): Payer: Medicaid Other | Admitting: Maternal Newborn

## 2017-03-26 VITALS — BP 110/60 | Wt 134.0 lb

## 2017-03-26 DIAGNOSIS — E639 Nutritional deficiency, unspecified: Secondary | ICD-10-CM

## 2017-03-26 DIAGNOSIS — O0992 Supervision of high risk pregnancy, unspecified, second trimester: Secondary | ICD-10-CM | POA: Insufficient documentation

## 2017-03-26 DIAGNOSIS — O99282 Endocrine, nutritional and metabolic diseases complicating pregnancy, second trimester: Principal | ICD-10-CM

## 2017-03-26 DIAGNOSIS — Z8759 Personal history of other complications of pregnancy, childbirth and the puerperium: Secondary | ICD-10-CM

## 2017-03-26 MED ORDER — ENSURE HEALTHY MOM PO LIQD: 1.0000 | Freq: Every day | ORAL | 3 refills | Status: DC

## 2017-03-26 NOTE — Progress Notes (Signed)
C/o no appetite; would like rx for [email or web address]

## 2017-03-26 NOTE — Progress Notes (Signed)
Routine Prenatal Care Visit  Subjective  Carolyn Petersen is a 27 y.o. Z6X0960 at [redacted]w[redacted]d being seen today for ongoing prenatal care.  She is currently monitored for the following issues for this high-risk pregnancy and has Poor nutritional intake affecting pregnancy in second trimester; History of prior pregnancy with IUGR newborn; and Supervision of high risk pregnancy in second trimester on their problem list.  ----------------------------------------------------------------------------------- Patient reports poor PO intake. She often eats only once daily and sometimes has trouble keeping food down. She cannot tolerate her prenatal vitamins. Says that she can tolerate liquids and is drinking enough. She has lost 6 lbs since her last visit. Requests nutritional supplement.  Vag. Bleeding: None.  Denies leaking of fluid.  ----------------------------------------------------------------------------------- The following portions of the patient's history were reviewed and updated as appropriate: allergies, current medications, past family history, past medical history, past social history, past surgical history and problem list. Problem list updated.   Objective  Blood pressure 110/60, weight 134 lb (60.8 kg), last menstrual period 11/12/2016. Pregravid weight Pregravid weight not on file Total Weight Gain Not found. Urinalysis:      Fetal Status: Fetal Heart Rate (bpm): 150         General:  Alert, oriented and cooperative. Patient is in no acute distress.  Skin: Skin is warm and dry. No rash noted.   Cardiovascular: Normal heart rate noted  Respiratory: Normal respiratory effort, no problems with respiration noted  Abdomen: Soft, gravid, appropriate for gestational age. Pain/Pressure: Absent     Pelvic:  Cervical exam deferred        Extremities: Normal range of motion.     Mental Status: Normal mood and affect. Normal behavior. Normal judgment and thought content.     Assessment     Carolyn y.o. [name] at [redacted]w[redacted]d, EDD 08/19/2017 by Last Menstrual Period presenting for routine prenatal visit.  Plan   pregnancy Problems (from 02/16/17 to present)    Problem Noted Resolved   History of prior pregnancy with IUGR newborn 03/26/2017 by Oswaldo Conroy, CNM No   Supervision of high risk pregnancy in second trimester 03/26/2017 by Oswaldo Conroy, CNM No   Overview Addendum 03/26/2017 10:56 AM by Oswaldo Conroy, CNM    [facility]inic [facility] Prenatal Labs  Dating L=6 Blood type: O/Positive/-- (01/04 1417)   Genetic Screen 1 Screen:    AFP:     Quad:     NIPS: Antibody:Negative (01/04 1417)  Anatomic [place]  Rubella: 3.19 (01/04 1417) Varicella: Immune  GTT Early:               Third trimester:  RPR: Non Reactive (01/04 1417)   Rhogam  HBsAg: Negative (01/04 1417)   TDaP vaccine                       Flu Shot: HIV: Non Reactive (01/04 1417)   Baby Food                                GBS:   Contraception  Pap: ASCUS/HPV Pos, needs colpo  CBB     CS/VBAC    Support Person               Discussed high calorie and high protein foods that may be tolerable. Advised trying another type of prenatal vitamin, possibly gummies, to see if she can keep them down. Rx sent for Ensure.  Advised increasing frequency of snacks and eating small amounts.  Patient is now aware of ASCUS/HPV positive results and will schedule colposcopy.   She was unable to void today, need UDS nv.  Preterm labor symptoms and general obstetric precautions including but not limited to vaginal bleeding andcontractions reviewed in detail with the patient.  Return in about 2 weeks (around 04/09/2017) for ROB following anatomy ultrasound.  Marcelyn BruinsJacelyn Lear Carstens, CNM 03/26/2017  10:58 AM

## 2017-04-09 ENCOUNTER — Ambulatory Visit (INDEPENDENT_AMBULATORY_CARE_PROVIDER_SITE_OTHER): Payer: Medicaid Other | Admitting: Obstetrics & Gynecology

## 2017-04-09 ENCOUNTER — Ambulatory Visit (INDEPENDENT_AMBULATORY_CARE_PROVIDER_SITE_OTHER): Payer: Medicaid Other

## 2017-04-09 ENCOUNTER — Other Ambulatory Visit: Payer: Self-pay | Admitting: Obstetrics and Gynecology

## 2017-04-09 VITALS — BP 100/60 | Wt 138.0 lb

## 2017-04-09 DIAGNOSIS — Z36 Encounter for antenatal screening for chromosomal anomalies: Secondary | ICD-10-CM | POA: Diagnosis not present

## 2017-04-09 DIAGNOSIS — O0992 Supervision of high risk pregnancy, unspecified, second trimester: Secondary | ICD-10-CM

## 2017-04-09 DIAGNOSIS — Z3A21 21 weeks gestation of pregnancy: Secondary | ICD-10-CM

## 2017-04-09 DIAGNOSIS — Z363 Encounter for antenatal screening for malformations: Secondary | ICD-10-CM

## 2017-04-09 NOTE — Patient Instructions (Signed)

## 2017-04-09 NOTE — Progress Notes (Signed)
  Subjective  Fetal Movement? yes Contractions? no Leaking Fluid? no Vaginal Bleeding? no  Objective  BP 100/60   Wt 138 lb (62.6 kg)   LMP 11/12/2016   BMI 25.24 kg/m  General: NAD Pumonary: no increased work of breathing Abdomen: gravid, non-tender Extremities: no edema Psychiatric: mood appropriate, affect full  Assessment  27 y.o. [id number] at 6951w1d by  08/19/2017, by Last Menstrual Period presenting for routine prenatal visit  Plan   Problem List Items Addressed This Visit      Other   Supervision of high risk pregnancy in second trimester   Relevant Orders   Drug Screen, Urine    Other Visit Diagnoses    [redacted] weeks gestation of pregnancy    -  Primary   Relevant Orders   Drug Screen, Urine    Review of ULTRASOUND.    [facility] and [facility]    Plan of management to be discussed with patient. Colpo scheduled; discussed rationale and approach in pregnancy/postpartum UDS due to history Bottle.  Contraception discussed.  Prefers Depo in hospital then Nexplanon Declines flu shot  Annamarie MajorPaul Lache Dagher, MD, Merlinda FrederickFACOG [facility], [facility] 04/09/2017  11:22 AM

## 2017-04-10 LAB — DRUG SCREEN, URINE
Amphetamines, Urine: NEGATIVE ng/mL
BENZODIAZEPINE QUANT UR: NEGATIVE ng/mL
Barbiturate screen, urine: NEGATIVE ng/mL
CANNABINOID QUANT UR: POSITIVE ng/mL — AB
COCAINE (METAB.): NEGATIVE ng/mL
Opiate Quant, Ur: NEGATIVE ng/mL
PCP QUANT UR: NEGATIVE ng/mL

## 2017-05-11 ENCOUNTER — Ambulatory Visit: Payer: Medicaid Other | Admitting: Obstetrics and Gynecology

## 2017-06-06 ENCOUNTER — Ambulatory Visit (INDEPENDENT_AMBULATORY_CARE_PROVIDER_SITE_OTHER): Payer: Medicaid Other | Admitting: Advanced Practice Midwife

## 2017-06-06 ENCOUNTER — Other Ambulatory Visit: Payer: Medicaid Other

## 2017-06-06 ENCOUNTER — Encounter: Payer: Self-pay | Admitting: Advanced Practice Midwife

## 2017-06-06 VITALS — BP 102/62 | Wt 143.0 lb

## 2017-06-06 DIAGNOSIS — Z113 Encounter for screening for infections with a predominantly sexual mode of transmission: Secondary | ICD-10-CM

## 2017-06-06 DIAGNOSIS — Z3A29 29 weeks gestation of pregnancy: Secondary | ICD-10-CM

## 2017-06-06 DIAGNOSIS — Z131 Encounter for screening for diabetes mellitus: Secondary | ICD-10-CM

## 2017-06-06 DIAGNOSIS — Z130 Encounter for screening for diseases of the blood and blood-forming organs and certain disorders involving the immune mechanism: Secondary | ICD-10-CM

## 2017-06-06 NOTE — Progress Notes (Signed)
ROB Abdominal pressure Pt unable to provide urine 28 week labs today

## 2017-06-06 NOTE — Patient Instructions (Signed)
Third Trimester of Pregnancy The third trimester is from week 28 through week 40 (months 7 through 9). The third trimester is a time when the unborn baby (fetus) is growing rapidly. At the end of the ninth month, the fetus is about 20 inches in length and weighs 6-10 pounds. Body changes during your third trimester Your body will continue to go through many changes during pregnancy. The changes vary from woman to woman. During the third trimester:  Your weight will continue to increase. You can expect to gain 25-35 pounds (11-16 kg) by the end of the pregnancy.  You may begin to get stretch marks on your hips, abdomen, and breasts.  You may urinate more often because the fetus is moving lower into your pelvis and pressing on your bladder.  You may develop or continue to have heartburn. This is caused by increased hormones that slow down muscles in the digestive tract.  You may develop or continue to have constipation because increased hormones slow digestion and cause the muscles that push waste through your intestines to relax.  You may develop hemorrhoids. These are swollen veins (varicose veins) in the rectum that can itch or be painful.  You may develop swollen, bulging veins (varicose veins) in your legs.  You may have increased body aches in the pelvis, back, or thighs. This is due to weight gain and increased hormones that are relaxing your joints.  You may have changes in your hair. These can include thickening of your hair, rapid growth, and changes in texture. Some women also have hair loss during or after pregnancy, or hair that feels dry or thin. Your hair will most likely return to normal after your baby is born.  Your breasts will continue to grow and they will continue to become tender. A yellow fluid (colostrum) may leak from your breasts. This is the first milk you are producing for your baby.  Your belly button may stick out.  You may notice more swelling in your hands,  face, or ankles.  You may have increased tingling or numbness in your hands, arms, and legs. The skin on your belly may also feel numb.  You may feel short of breath because of your expanding uterus.  You may have more problems sleeping. This can be caused by the size of your belly, increased need to urinate, and an increase in your body's metabolism.  You may notice the fetus "dropping," or moving lower in your abdomen (lightening).  You may have increased vaginal discharge.  You may notice your joints feel loose and you may have pain around your pelvic bone.  What to expect at prenatal visits You will have prenatal exams every 2 weeks until week 36. Then you will have weekly prenatal exams. During a routine prenatal visit:  You will be weighed to make sure you and the baby are growing normally.  Your blood pressure will be taken.  Your abdomen will be measured to track your baby's growth.  The fetal heartbeat will be listened to.  Any test results from the previous visit will be discussed.  You may have a cervical check near your due date to see if your cervix has softened or thinned (effaced).  You will be tested for Group B streptococcus. This happens between 35 and 37 weeks.  Your health care provider may ask you:  What your birth plan is.  How you are feeling.  If you are feeling the baby move.  If you have had   any abnormal symptoms, such as leaking fluid, bleeding, severe headaches, or abdominal cramping.  If you are using any tobacco products, including cigarettes, chewing tobacco, and electronic cigarettes.  If you have any questions.  Other tests or screenings that may be performed during your third trimester include:  Blood tests that check for low iron levels (anemia).  Fetal testing to check the health, activity level, and growth of the fetus. Testing is done if you have certain medical conditions or if there are problems during the  pregnancy.  Nonstress test (NST). This test checks the health of your baby to make sure there are no signs of problems, such as the baby not getting enough oxygen. During this test, a belt is placed around your belly. The baby is made to move, and its heart rate is monitored during movement.  What is false labor? False labor is a condition in which you feel small, irregular tightenings of the muscles in the womb (contractions) that usually go away with rest, changing position, or drinking water. These are called Braxton Hicks contractions. Contractions may last for hours, days, or even weeks before true labor sets in. If contractions come at regular intervals, become more frequent, increase in intensity, or become painful, you should see your health care provider. What are the signs of labor?  Abdominal cramps.  Regular contractions that start at 10 minutes apart and become stronger and more frequent with time.  Contractions that start on the top of the uterus and spread down to the lower abdomen and back.  Increased pelvic pressure and dull back pain.  A watery or bloody mucus discharge that comes from the vagina.  Leaking of amniotic fluid. This is also known as your "water breaking." It could be a slow trickle or a gush. Let your health care provider know if it has a color or strange odor. If you have any of these signs, call your health care provider right away, even if it is before your due date. Follow these instructions at home: Medicines  Follow your health care provider's instructions regarding medicine use. Specific medicines may be either safe or unsafe to take during pregnancy.  Take a prenatal vitamin that contains at least 600 micrograms (mcg) of folic acid.  If you develop constipation, try taking a stool softener if your health care provider approves. Eating and drinking  Eat a balanced diet that includes fresh fruits and vegetables, whole grains, good sources of protein  such as meat, eggs, or tofu, and low-fat dairy. Your health care provider will help you determine the amount of weight gain that is right for you.  Avoid raw meat and uncooked cheese. These carry germs that can cause birth defects in the baby.  If you have low calcium intake from food, talk to your health care provider about whether you should take a daily calcium supplement.  Eat four or five small meals rather than three large meals a day.  Limit foods that are high in fat and processed sugars, such as fried and sweet foods.  To prevent constipation: ? Drink enough fluid to keep your urine clear or pale yellow. ? Eat foods that are high in fiber, such as fresh fruits and vegetables, whole grains, and beans. Activity  Exercise only as directed by your health care provider. Most women can continue their usual exercise routine during pregnancy. Try to exercise for 30 minutes at least 5 days a week. Stop exercising if you experience uterine contractions.  Avoid heavy   lifting.  Do not exercise in extreme heat or humidity, or at high altitudes.  Wear low-heel, comfortable shoes.  Practice good posture.  You may continue to have sex unless your health care provider tells you otherwise. Relieving pain and discomfort  Take frequent breaks and rest with your legs elevated if you have leg cramps or low back pain.  Take warm sitz baths to soothe any pain or discomfort caused by hemorrhoids. Use hemorrhoid cream if your health care provider approves.  Wear a good support bra to prevent discomfort from breast tenderness.  If you develop varicose veins: ? Wear support pantyhose or compression stockings as told by your healthcare provider. ? Elevate your feet for 15 minutes, 3-4 times a day. Prenatal care  Write down your questions. Take them to your prenatal visits.  Keep all your prenatal visits as told by your health care provider. This is important. Safety  Wear your seat belt at  all times when driving.  Make a list of emergency phone numbers, including numbers for family, friends, the hospital, and police and fire departments. General instructions  Avoid cat litter boxes and soil used by cats. These carry germs that can cause birth defects in the baby. If you have a cat, ask someone to clean the litter box for you.  Do not travel far distances unless it is absolutely necessary and only with the approval of your health care provider.  Do not use hot tubs, steam rooms, or saunas.  Do not drink alcohol.  Do not use any products that contain nicotine or tobacco, such as cigarettes and e-cigarettes. If you need help quitting, ask your health care provider.  Do not use any medicinal herbs or unprescribed drugs. These chemicals affect the formation and growth of the baby.  Do not douche or use tampons or scented sanitary pads.  Do not cross your legs for long periods of time.  To prepare for the arrival of your baby: ? Take prenatal classes to understand, practice, and ask questions about labor and delivery. ? Make a trial run to the hospital. ? Visit the hospital and tour the maternity area. ? Arrange for maternity or paternity leave through employers. ? Arrange for family and friends to take care of pets while you are in the hospital. ? Purchase a rear-facing car seat and make sure you know how to install it in your car. ? Pack your hospital bag. ? Prepare the baby's nursery. Make sure to remove all pillows and stuffed animals from the baby's crib to prevent suffocation.  Visit your dentist if you have not gone during your pregnancy. Use a soft toothbrush to brush your teeth and be gentle when you floss. Contact a health care provider if:  You are unsure if you are in labor or if your water has broken.  You become dizzy.  You have mild pelvic cramps, pelvic pressure, or nagging pain in your abdominal area.  You have lower back pain.  You have persistent  nausea, vomiting, or diarrhea.  You have an unusual or bad smelling vaginal discharge.  You have pain when you urinate. Get help right away if:  Your water breaks before 37 weeks.  You have regular contractions less than 5 minutes apart before 37 weeks.  You have a fever.  You are leaking fluid from your vagina.  You have spotting or bleeding from your vagina.  You have severe abdominal pain or cramping.  You have rapid weight loss or weight gain.    You have shortness of breath with chest pain.  You notice sudden or extreme swelling of your face, hands, ankles, feet, or legs.  Your baby makes fewer than 10 movements in 2 hours.  You have severe headaches that do not go away when you take medicine.  You have vision changes. Summary  The third trimester is from week 28 through week 40, months 7 through 9. The third trimester is a time when the unborn baby (fetus) is growing rapidly.  During the third trimester, your discomfort may increase as you and your baby continue to gain weight. You may have abdominal, leg, and back pain, sleeping problems, and an increased need to urinate.  During the third trimester your breasts will keep growing and they will continue to become tender. A yellow fluid (colostrum) may leak from your breasts. This is the first milk you are producing for your baby.  False labor is a condition in which you feel small, irregular tightenings of the muscles in the womb (contractions) that eventually go away. These are called Braxton Hicks contractions. Contractions may last for hours, days, or even weeks before true labor sets in.  Signs of labor can include: abdominal cramps; regular contractions that start at 10 minutes apart and become stronger and more frequent with time; watery or bloody mucus discharge that comes from the vagina; increased pelvic pressure and dull back pain; and leaking of amniotic fluid. This information is not intended to replace advice  given to you by your health care provider. Make sure you discuss any questions you have with your health care provider. Document Released: 01/24/2001 Document Revised: 07/08/2015 Document Reviewed: 04/02/2012 [employer] Interactive Patient Education  2017 [employer]  

## 2017-06-06 NOTE — Progress Notes (Signed)
Routine Prenatal Care Visit  Subjective  Carolyn Petersen is a 27 y.o. [id number] at [redacted]w[redacted]d being seen today for ongoing prenatal care.  She is currently monitored for the following issues for this high-risk pregnancy and has Poor nutritional intake affecting pregnancy in second trimester; History of prior pregnancy with IUGR newborn; and Supervision of high risk pregnancy in second trimester on their problem list.  ----------------------------------------------------------------------------------- Patient reports pelvic pressure.  She had some irregular contractions a few days ago on Sunday. Contractions: Irregular. Vag. Bleeding: None.  Movement: Present. Denies leaking of fluid.  ----------------------------------------------------------------------------------- The following portions of the patient's history were reviewed and updated as appropriate: allergies, current medications, past family history, past medical history, past social history, past surgical history and problem list. Problem list updated.   Objective  Blood pressure 102/62, weight 143 lb (64.9 kg), last menstrual period 11/12/2016 Pregravid weight Pregravid weight not on file Total Weight Gain Not found. Urinalysis:      Fetal Status: Fetal Heart Rate (bpm): 148 Fundal Height: 28 cm Movement: Present     General:  Alert, oriented and cooperative. Patient is in no acute distress.  Skin: Skin is warm and dry. No rash noted.   Cardiovascular: Normal heart rate noted  Respiratory: Normal respiratory effort, no problems with respiration noted  Abdomen: Soft, gravid, appropriate for gestational age. Pain/Pressure: Present     Pelvic:  Cervical exam deferred        Extremities: Normal range of motion.  Edema: None  Mental Status: Normal mood and affect. Normal behavior. Normal judgment and thought content.   Assessment   27 y.o. [name] at [redacted]w[redacted]d by  08/19/2017, by Last Menstrual Period presenting for routine prenatal  visit  Plan   pregnancy Problems (from 02/16/17 to present)    Problem Noted Resolved   History of prior pregnancy with IUGR newborn 03/26/2017 by Oswaldo Conroy, CNM No   Supervision of high risk pregnancy in second trimester 03/26/2017 by Oswaldo Conroy, CNM No   Overview Addendum 03/26/2017 10:56 AM by Oswaldo Conroy, CNM    [facility] Prenatal Labs  Dating L=6 Blood type: O/Positive/-- (01/04 1417)   Genetic Screen 1 Screen:    AFP:     Quad:     NIPS: Antibody:Negative (01/04 1417)  Anatomic [place]  Rubella: 3.19 (01/04 1417) Varicella: Immune  GTT Early:               Third trimester:  RPR: Non Reactive (01/04 1417)   Rhogam  HBsAg: Negative (01/04 1417)   TDaP vaccine                       Flu Shot: HIV: Non Reactive (01/04 1417)   Baby Food                                GBS:   Contraception  Pap: ASCUS/HPV Pos, needs colpo  CBB     CS/VBAC    Support Person                  Preterm labor symptoms and general obstetric precautions including but not limited to vaginal bleeding, contractions, leaking of fluid and fetal movement were reviewed in detail with the patient. Please refer to After Visit Summary for other counseling recommendations.  Encouraged adequate hydration and hands/knees and stretches for pelvic comfort  Return in about 2 weeks (around 06/20/2017) for  rob.  Carolyn Petersen, CNM 06/06/2017 2:32 PM

## 2017-06-15 ENCOUNTER — Other Ambulatory Visit: Payer: Medicaid Other

## 2017-06-15 ENCOUNTER — Encounter: Payer: Medicaid Other | Admitting: Advanced Practice Midwife

## 2017-06-15 ENCOUNTER — Other Ambulatory Visit: Payer: Self-pay | Admitting: Advanced Practice Midwife

## 2017-06-15 DIAGNOSIS — Z130 Encounter for screening for diseases of the blood and blood-forming organs and certain disorders involving the immune mechanism: Secondary | ICD-10-CM

## 2017-06-15 DIAGNOSIS — Z3A30 30 weeks gestation of pregnancy: Secondary | ICD-10-CM

## 2017-06-15 DIAGNOSIS — Z113 Encounter for screening for infections with a predominantly sexual mode of transmission: Secondary | ICD-10-CM

## 2017-06-15 DIAGNOSIS — Z131 Encounter for screening for diabetes mellitus: Secondary | ICD-10-CM

## 2017-06-15 NOTE — Progress Notes (Signed)
28 wk lab order entered

## 2017-06-16 LAB — 28 WEEK RH+PANEL
BASOS ABS: 0 10*3/uL (ref 0.0–0.2)
Basos: 0 %
EOS (ABSOLUTE): 0.6 10*3/uL — AB (ref 0.0–0.4)
Eos: 8 %
Gestational Diabetes Screen: 106 mg/dL (ref 65–139)
HEMATOCRIT: 34 % (ref 34.0–46.6)
HEMOGLOBIN: 11.3 g/dL (ref 11.1–15.9)
HIV Screen 4th Generation wRfx: NONREACTIVE
Immature Grans (Abs): 0 10*3/uL (ref 0.0–0.1)
Immature Granulocytes: 0 %
LYMPHS ABS: 1.5 10*3/uL (ref 0.7–3.1)
Lymphs: 23 %
MCH: 30.3 pg (ref 26.6–33.0)
MCHC: 33.2 g/dL (ref 31.5–35.7)
MCV: 91 fL (ref 79–97)
MONOCYTES: 5 %
Monocytes Absolute: 0.4 10*3/uL (ref 0.1–0.9)
Neutrophils Absolute: 4.3 10*3/uL (ref 1.4–7.0)
Neutrophils: 64 %
PLATELETS: 197 10*3/uL (ref 150–379)
RBC: 3.73 x10E6/uL — ABNORMAL LOW (ref 3.77–5.28)
RDW: 14.3 % (ref 12.3–15.4)
RPR: NONREACTIVE
WBC: 6.7 10*3/uL (ref 3.4–10.8)

## 2017-06-18 ENCOUNTER — Telehealth: Payer: Self-pay

## 2017-06-18 NOTE — Telephone Encounter (Signed)
Pt called triage line had questions, I called her back and left a message to return my call.

## 2017-06-19 NOTE — Telephone Encounter (Signed)
Pt states she already talked to someone today

## 2017-06-20 ENCOUNTER — Encounter: Payer: Self-pay | Admitting: Advanced Practice Midwife

## 2017-06-20 ENCOUNTER — Ambulatory Visit (INDEPENDENT_AMBULATORY_CARE_PROVIDER_SITE_OTHER): Payer: Medicaid Other | Admitting: Advanced Practice Midwife

## 2017-06-20 VITALS — BP 114/70 | Wt 145.0 lb

## 2017-06-20 DIAGNOSIS — Z3A31 31 weeks gestation of pregnancy: Secondary | ICD-10-CM

## 2017-06-20 NOTE — Progress Notes (Addendum)
Routine Prenatal Care Visit  Subjective  Carolyn Petersen is a 27 y.o. [id number] at [redacted]w[redacted]d being seen today for ongoing prenatal care.  She is currently monitored for the following issues for this high-risk pregnancy and has Poor nutritional intake affecting pregnancy in second trimester; History of prior pregnancy with IUGR newborn; and Supervision of high risk pregnancy in second trimester on their problem list.  ----------------------------------------------------------------------------------- Patient reports upset stomach.  She thinks she ate too much candy. Contractions: Not present. Vag. Bleeding: None.  Movement: Present. Denies leaking of fluid.  ----------------------------------------------------------------------------------- The following portions of the patient's history were reviewed and updated as appropriate: allergies, current medications, past family history, past medical history, past social history, past surgical history and problem list. Problem list updated.   Objective  Blood pressure 114/70, weight 145 lb (65.8 kg), last menstrual period 11/12/2016 Pregravid weight Pregravid weight not on file Total Weight Gain Not found. Urinalysis: Urine Protein: Negative Urine Glucose: Negative  Fetal Status: Fetal Heart Rate (bpm): 147 Fundal Height: 30 cm Movement: Present     General:  Alert, oriented and cooperative. Patient is in no acute distress.  Skin: Skin is warm and dry. No rash noted.   Cardiovascular: Normal heart rate noted  Respiratory: Normal respiratory effort, no problems with respiration noted  Abdomen: Soft, gravid, appropriate for gestational age. Pain/Pressure: Absent     Pelvic:  Cervical exam deferred        Extremities: Normal range of motion.  Edema: None  Mental Status: Normal mood and affect. Normal behavior. Normal judgment and thought content.   Assessment   27 y.o. [id number] at [redacted]w[redacted]d by  08/19/2017, by Last Menstrual Period presenting for routine  prenatal visit  Plan   pregnancy Problems (from 02/16/17 to present)    Problem Noted Resolved   History of prior pregnancy with IUGR newborn 03/26/2017 by Oswaldo Conroy, CNM No   Supervision of high risk pregnancy in second trimester 03/26/2017 by Oswaldo Conroy, CNM No   Overview Addendum 03/26/2017 10:56 AM by Oswaldo Conroy, CNM    [facility] Prenatal Labs  Dating L=6 Blood type: O/Positive/-- (01/04 1417)   Genetic Screen 1 Screen:    AFP:     Quad:     NIPS: Antibody:Negative (01/04 1417)  Anatomic [place]  Rubella: 3.19 (01/04 1417) Varicella: Immune  GTT Early:               Third trimester:  RPR: Non Reactive (01/04 1417)   Rhogam  HBsAg: Negative (01/04 1417)   TDaP vaccine                       Flu Shot: HIV: Non Reactive (01/04 1417)   Baby Food                                GBS:   Contraception  Pap: ASCUS/HPV Pos, needs colpo  CBB     CS/VBAC    Support Person                  Preterm labor symptoms and general obstetric precautions including but not limited to vaginal bleeding, contractions, leaking of fluid and fetal movement were reviewed in detail with the patient. Recommended ginger or peppermint for upset stomach.  Reminded patient about need for colposcopy.  Return in about 2 weeks (around 07/04/2017) for rob.  Tresea Mall, CNM 06/20/2017 4:16 PM

## 2017-06-20 NOTE — Progress Notes (Signed)
No vb no lof.  

## 2017-07-12 ENCOUNTER — Ambulatory Visit (INDEPENDENT_AMBULATORY_CARE_PROVIDER_SITE_OTHER): Payer: Medicaid Other | Admitting: Advanced Practice Midwife

## 2017-07-12 ENCOUNTER — Encounter: Payer: Self-pay | Admitting: Advanced Practice Midwife

## 2017-07-12 VITALS — BP 120/66 | Wt 143.0 lb

## 2017-07-12 DIAGNOSIS — Z3A34 34 weeks gestation of pregnancy: Secondary | ICD-10-CM

## 2017-07-12 NOTE — Progress Notes (Signed)
  Routine Prenatal Care Visit  Subjective  Carolyn Petersen is a 26 y.o. G5P4004 at [redacted]w[redacted]d being seen today for ongoing prenatal care.  She is currently monitored for the following issues for this high-risk pregnancy and has Poor nutritional intake affecting pregnancy in second trimester; History of prior pregnancy with IUGR newborn; and Supervision of high risk pregnancy in second trimester on their problem list.  ----------------------------------------------------------------------------------- Patient reports no complaints.  She admits poor appetite. She is taking Ensure drinks. Contractions: Not present. Vag. Bleeding: None.  Movement: Present. Denies leaking of fluid.  ----------------------------------------------------------------------------------- The following portions of the patient's history were reviewed and updated as appropriate: allergies, current medications, past family history, past medical history, past social history, past surgical history and problem list. Problem list updated.   Objective  Blood pressure 120/66, weight 143 lb (64.9 kg), last menstrual period 11/12/2016 Pregravid weight Pregravid weight not on file Total Weight Gain Not found. Urinalysis:      Fetal Status: Fetal Heart Rate (bpm): 138 Fundal Height: 33 cm Movement: Present     General:  Alert, oriented and cooperative. Patient is in no acute distress.  Skin: Skin is warm and dry. No rash noted.   Cardiovascular: Normal heart rate noted  Respiratory: Normal respiratory effort, no problems with respiration noted  Abdomen: Soft, gravid, appropriate for gestational age. Pain/Pressure: Absent     Pelvic:  Cervical exam deferred        Extremities: Normal range of motion.  Edema: None  Mental Status: Normal mood and affect. Normal behavior. Normal judgment and thought content.   Assessment   27 y.o. [id number] at [redacted]w[redacted]d by  08/19/2017, by Last Menstrual Period presenting for routine prenatal visit  Plan    pregnancy Problems (from 02/16/17 to present)    Problem Noted Resolved   History of prior pregnancy with IUGR newborn 03/26/2017 by Oswaldo Conroy, CNM No   Supervision of high risk pregnancy in second trimester 03/26/2017 by Oswaldo Conroy, CNM No   Overview Addendum 03/26/2017 10:56 AM by Oswaldo Conroy, CNM    [facility] Prenatal Labs  Dating L=6 Blood type: O/Positive/-- (01/04 1417)   Genetic Screen 1 Screen:    AFP:     Quad:     NIPS: Antibody:Negative (01/04 1417)  Anatomic [place]  Rubella: 3.19 (01/04 1417) Varicella: Immune  GTT Early:               Third trimester:  RPR: Non Reactive (01/04 1417)   Rhogam  HBsAg: Negative (01/04 1417)   TDaP vaccine                       Flu Shot: HIV: Non Reactive (01/04 1417)   Baby Food                                GBS:   Contraception  Pap: ASCUS/HPV Pos, needs colpo  CBB     CS/VBAC    Support Person                  Preterm labor symptoms and general obstetric precautions including but not limited to vaginal bleeding, contractions, leaking of fluid and fetal movement were reviewed in detail with the patient.    Return in about 2 weeks (around 07/26/2017) for rob.  Tresea Mall, CNM 07/12/2017 2:45 PM

## 2017-07-12 NOTE — Progress Notes (Signed)
ROB

## 2017-07-25 IMAGING — CT CT ANGIO CHEST
2 of 6 series · 19 of 46 positions shown · IV contrast (APPLIED)
Comparison: Chest CT 06/30/2007

CLINICAL DATA: Left-sided chest pain, shortness of breath, bloody
sputum. Thirty-two weeks pregnant.

EXAM:
CT ANGIOGRAPHY CHEST WITH CONTRAST
TECHNIQUE: Multidetector CT imaging of the chest was performed using the
standard protocol during bolus administration of intravenous
contrast. Multiplanar CT image reconstructions and MIPs were
obtained to evaluate the vascular anatomy.
CONTRAST:  100 mL Isovue 370 IV

[Series 8: thins · axial · 0.70mm/px · z∈[-405,-187]mm · 17 of 240 slices shown]
[im 11/240  lung]
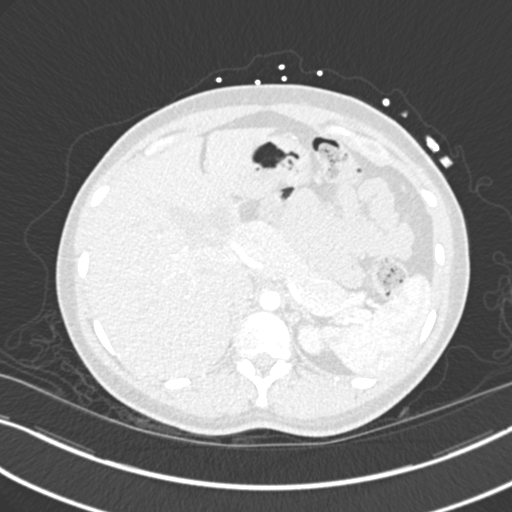
[im 21/240  soft-tissue]
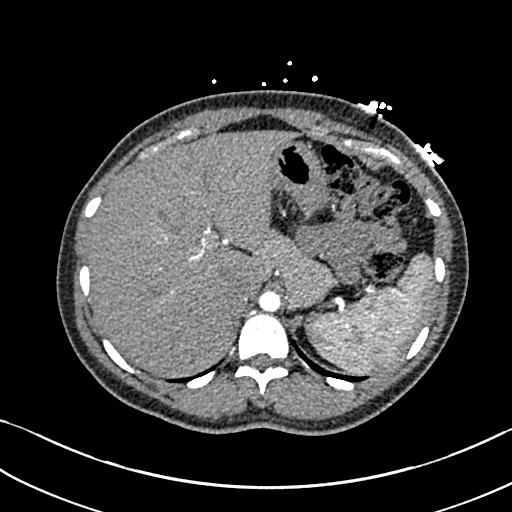
[im 42/240  lung]
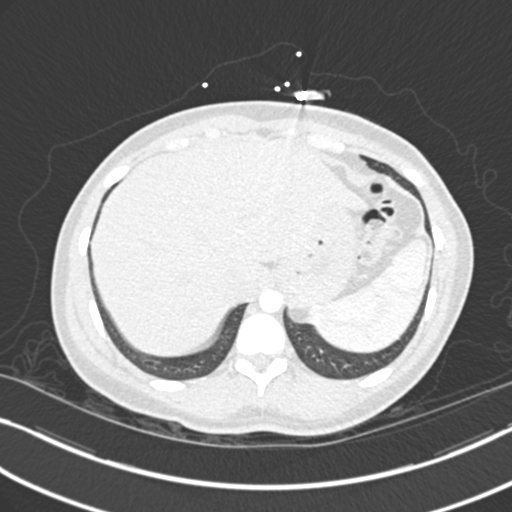
[im 52/240  soft-tissue]
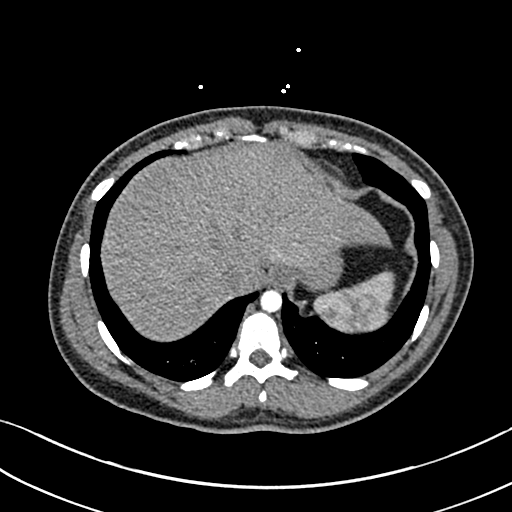
[im 63/240  lung]
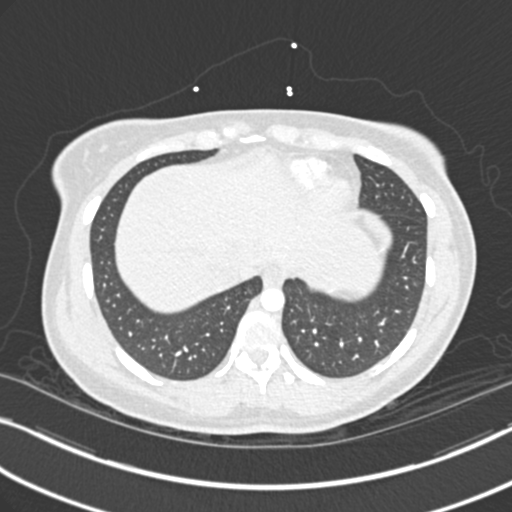
[im 84/240  soft-tissue]
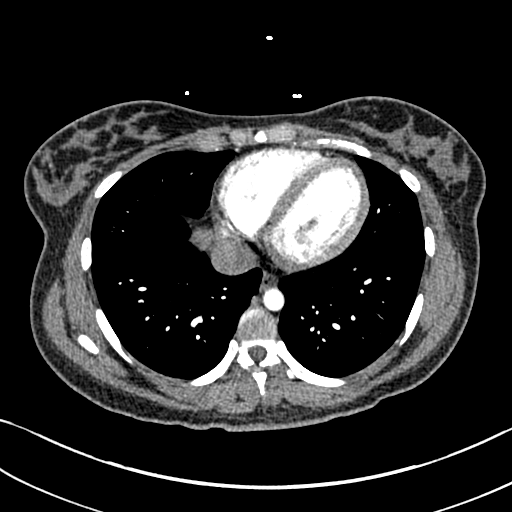
[im 94/240  lung]
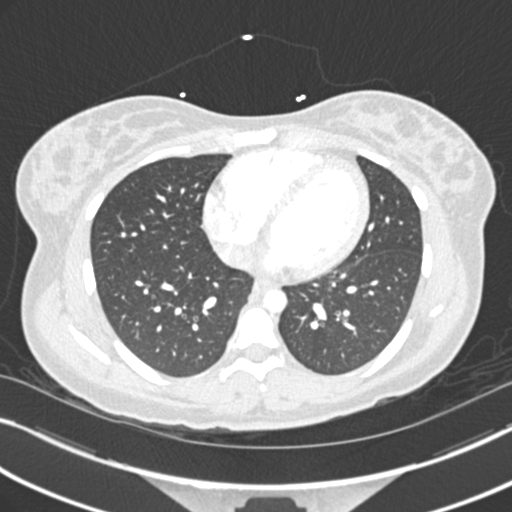
[im 104/240  soft-tissue]
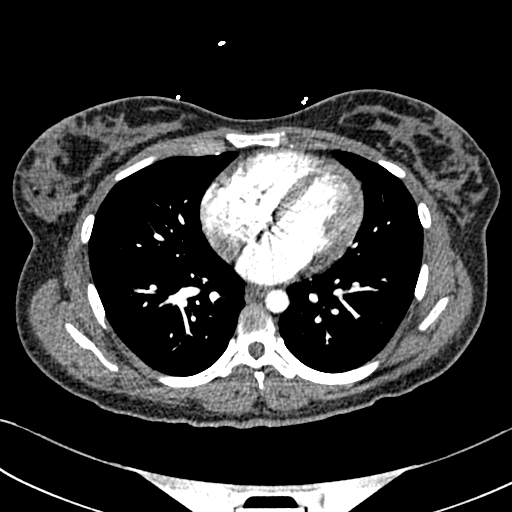
[im 125/240  lung]
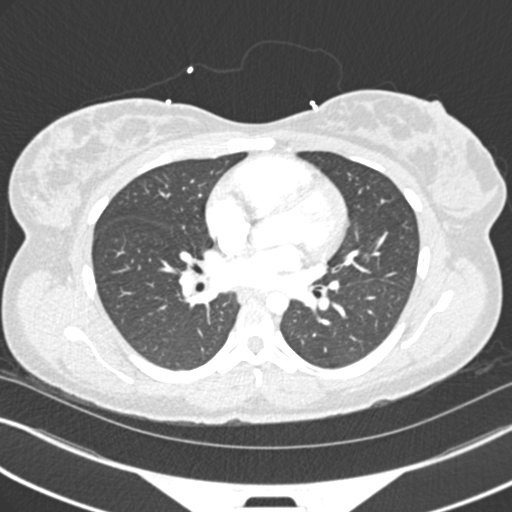
[im 136/240  soft-tissue]
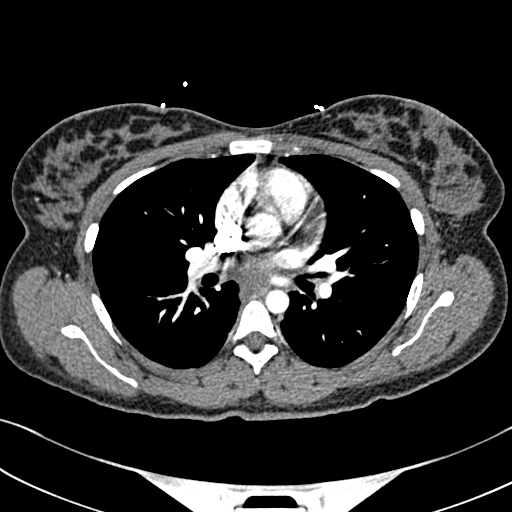
[im 146/240  lung]
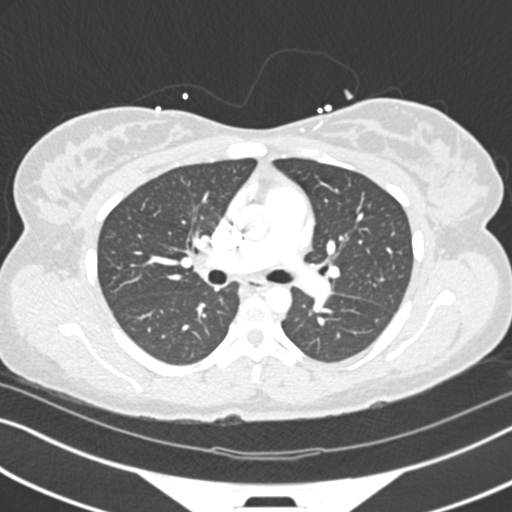
[im 156/240  soft-tissue]
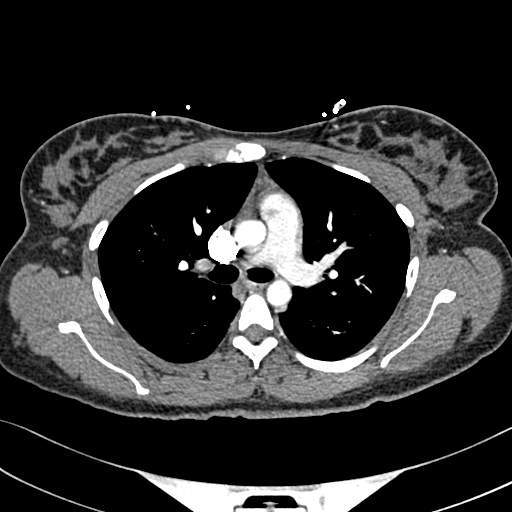
[im 177/240  lung]
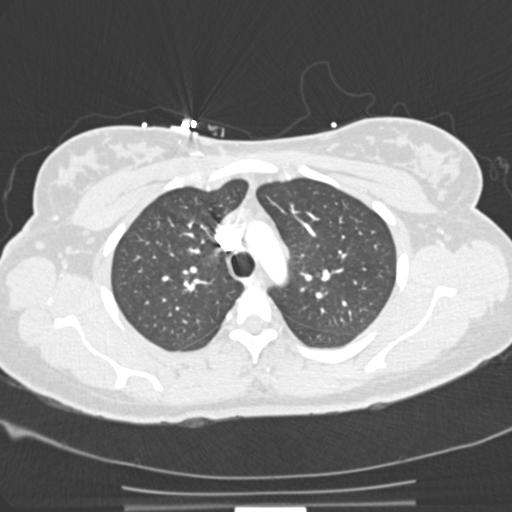
[im 188/240  soft-tissue]
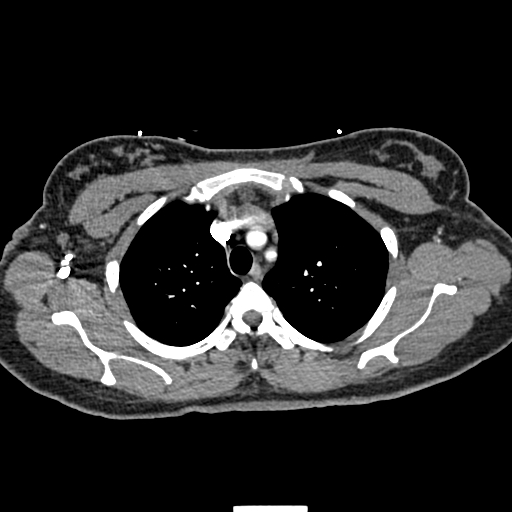
[im 198/240  lung]
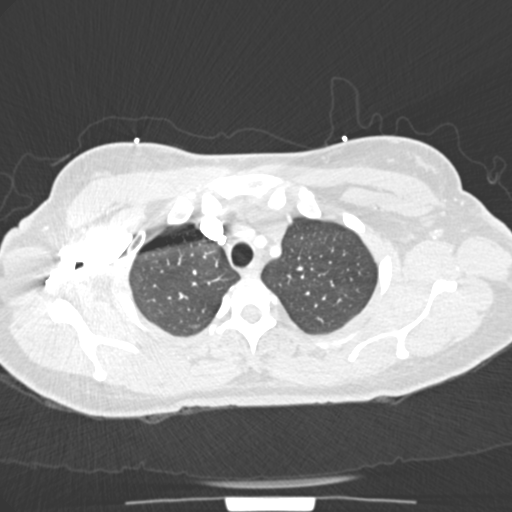
[im 219/240  soft-tissue]
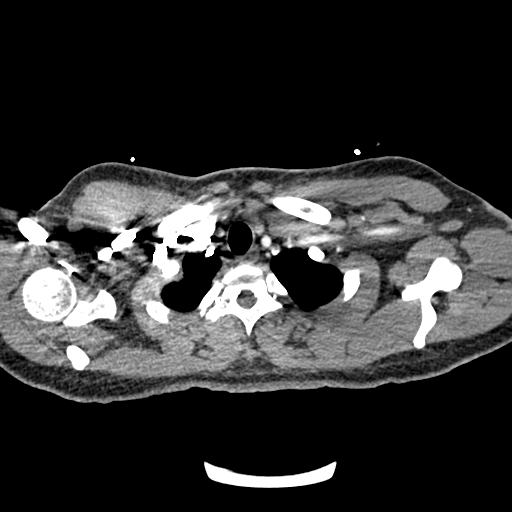
[im 229/240  lung]
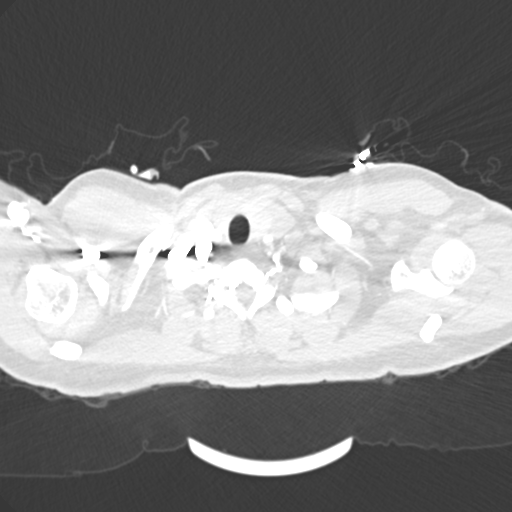

[Series 10: coronal mpr · coronal · 0.49mm/px · 2 of 77 slices shown]
[im 26/77  soft-tissue]
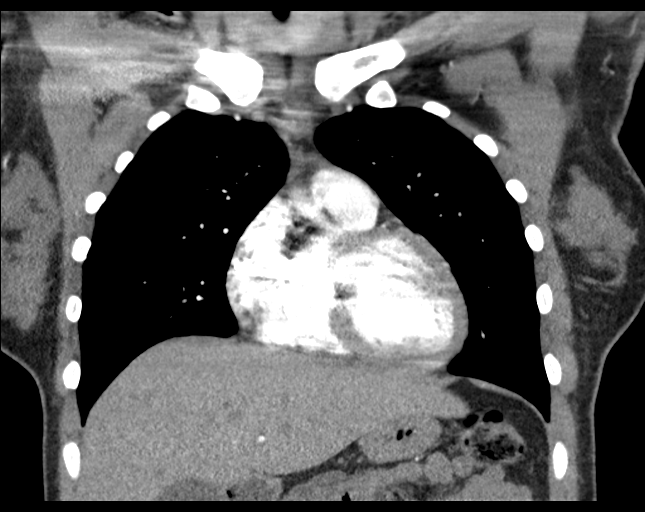
[im 51/77  soft-tissue]
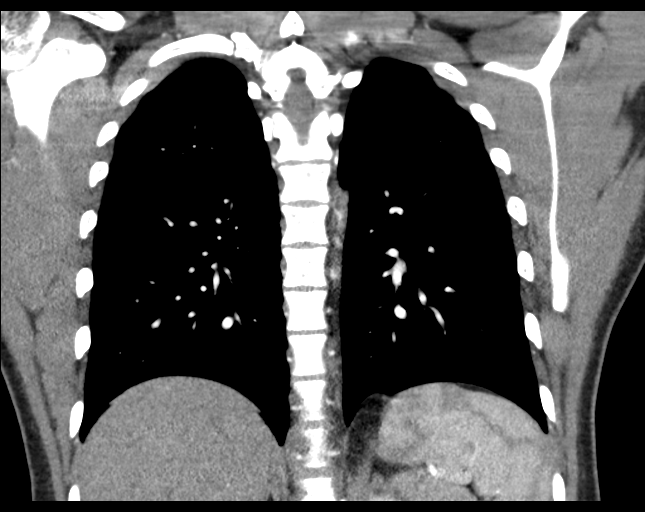

[19 of 46 positions shown; findings below may reference images not displayed]

FINDINGS: Vascular: No pulmonary embolus. The main pulmonary artery is within
normal limits for size. No CT evidence of acute right heart strain.
Visualized portion of the aorta is normal. Heart size is normal.

Mediastinum/Nodes: No mediastinal or axillary adenopathy.

Lungs: No pulmonary nodules or masses. No pleural effusion or focal
consolidation.

Visualized abdomen: Contrast bolus timing is not optimized for
evaluation of the abdominal organs. Within this limitation, the
visualized organs of the upper abdomen are normal.

Musculoskeletal: No lytic or blastic osseous lesions. The visualized
extrathoracic soft tissues are normal.

Review of the MIP images confirms the above findings.
IMPRESSION: 1. No pulmonary embolus.
2. Normal chest CT.

## 2017-07-26 ENCOUNTER — Ambulatory Visit (INDEPENDENT_AMBULATORY_CARE_PROVIDER_SITE_OTHER): Payer: Medicaid Other | Admitting: Obstetrics & Gynecology

## 2017-07-26 VITALS — BP 100/60 | Wt 144.0 lb

## 2017-07-26 DIAGNOSIS — O0993 Supervision of high risk pregnancy, unspecified, third trimester: Secondary | ICD-10-CM

## 2017-07-26 DIAGNOSIS — Z3A36 36 weeks gestation of pregnancy: Secondary | ICD-10-CM

## 2017-07-26 DIAGNOSIS — Z8759 Personal history of other complications of pregnancy, childbirth and the puerperium: Secondary | ICD-10-CM

## 2017-07-26 DIAGNOSIS — O0992 Supervision of high risk pregnancy, unspecified, second trimester: Secondary | ICD-10-CM

## 2017-07-26 LAB — OB RESULTS CONSOLE GBS: GBS: NEGATIVE

## 2017-07-26 NOTE — Progress Notes (Addendum)
  Subjective  Fetal Movement? yes Contractions? yes Leaking Fluid? no Vaginal Bleeding? no  Objective  BP 100/60   Wt 144 lb (65.3 kg)   LMP 11/12/2016   BMI 26.34 kg/m  General: NAD Pumonary: no increased work of breathing Abdomen: gravid, non-tender Extremities: no edema Psychiatric: mood appropriate, affect full SVE FT/20/-3 Assessment  27 y.o. [id number] at 4858w4d by  08/19/2017, by Last Menstrual Period presenting for routine prenatal visit  Plan   Problem List Items Addressed This Visit      Other   History of prior pregnancy with IUGR newborn   Relevant Orders   US OB Follow Up   Supervision of high risk pregnancy in second trimester   Relevant Orders   US OB Follow Up   Drug Screen, Urine    Other Visit Diagnoses    [redacted] weeks gestation of pregnancy    -  Primary   Relevant Orders   Culture, beta strep (group b only)   US OB Follow Up    Labor precautions  Annamarie MajorPaul Arsenia Goracke, MD, Merlinda FrederickFACOG [facility], [facility] 07/26/2017  4:39 PM   ADDENDUM: UDS returns POS COCAINE results Counsel next appointment severity of this risk factor on pregnancy and fetus Offer options for help to stop drug use  Annamarie MajorPaul Daimien Patmon, MD, Merlinda FrederickFACOG [facility], [facility] 07/27/2017  1:40 PM

## 2017-07-26 NOTE — Patient Instructions (Signed)
Third Trimester of Pregnancy The third trimester is from week 28 through week 40 (months 7 through 9). The third trimester is a time when the unborn baby (fetus) is growing rapidly. At the end of the ninth month, the fetus is about 20 inches in length and weighs 6-10 pounds. Body changes during your third trimester Your body will continue to go through many changes during pregnancy. The changes vary from woman to woman. During the third trimester:  Your weight will continue to increase. You can expect to gain 25-35 pounds (11-16 kg) by the end of the pregnancy.  You may begin to get stretch marks on your hips, abdomen, and breasts.  You may urinate more often because the fetus is moving lower into your pelvis and pressing on your bladder.  You may develop or continue to have heartburn. This is caused by increased hormones that slow down muscles in the digestive tract.  You may develop or continue to have constipation because increased hormones slow digestion and cause the muscles that push waste through your intestines to relax.  You may develop hemorrhoids. These are swollen veins (varicose veins) in the rectum that can itch or be painful.  You may develop swollen, bulging veins (varicose veins) in your legs.  You may have increased body aches in the pelvis, back, or thighs. This is due to weight gain and increased hormones that are relaxing your joints.  You may have changes in your hair. These can include thickening of your hair, rapid growth, and changes in texture. Some women also have hair loss during or after pregnancy, or hair that feels dry or thin. Your hair will most likely return to normal after your baby is born.  Your breasts will continue to grow and they will continue to become tender. A yellow fluid (colostrum) may leak from your breasts. This is the first milk you are producing for your baby.  Your belly button may stick out.  You may notice more swelling in your hands,  face, or ankles.  You may have increased tingling or numbness in your hands, arms, and legs. The skin on your belly may also feel numb.  You may feel short of breath because of your expanding uterus.  You may have more problems sleeping. This can be caused by the size of your belly, increased need to urinate, and an increase in your body's metabolism.  You may notice the fetus "dropping," or moving lower in your abdomen (lightening).  You may have increased vaginal discharge.  You may notice your joints feel loose and you may have pain around your pelvic bone.  What to expect at prenatal visits You will have prenatal exams every 2 weeks until week 36. Then you will have weekly prenatal exams. During a routine prenatal visit:  You will be weighed to make sure you and the baby are growing normally.  Your blood pressure will be taken.  Your abdomen will be measured to track your baby's growth.  The fetal heartbeat will be listened to.  Any test results from the previous visit will be discussed.  You may have a cervical check near your due date to see if your cervix has softened or thinned (effaced).  You will be tested for Group B streptococcus. This happens between 35 and 37 weeks.  Your health care provider may ask you:  What your birth plan is.  How you are feeling.  If you are feeling the baby move.  If you have had   any abnormal symptoms, such as leaking fluid, bleeding, severe headaches, or abdominal cramping.  If you are using any tobacco products, including cigarettes, chewing tobacco, and electronic cigarettes.  If you have any questions.  Other tests or screenings that may be performed during your third trimester include:  Blood tests that check for low iron levels (anemia).  Fetal testing to check the health, activity level, and growth of the fetus. Testing is done if you have certain medical conditions or if there are problems during the  pregnancy.  Nonstress test (NST). This test checks the health of your baby to make sure there are no signs of problems, such as the baby not getting enough oxygen. During this test, a belt is placed around your belly. The baby is made to move, and its heart rate is monitored during movement.  What is false labor? False labor is a condition in which you feel small, irregular tightenings of the muscles in the womb (contractions) that usually go away with rest, changing position, or drinking water. These are called Braxton Hicks contractions. Contractions may last for hours, days, or even weeks before true labor sets in. If contractions come at regular intervals, become more frequent, increase in intensity, or become painful, you should see your health care provider. What are the signs of labor?  Abdominal cramps.  Regular contractions that start at 10 minutes apart and become stronger and more frequent with time.  Contractions that start on the top of the uterus and spread down to the lower abdomen and back.  Increased pelvic pressure and dull back pain.  A watery or bloody mucus discharge that comes from the vagina.  Leaking of amniotic fluid. This is also known as your "water breaking." It could be a slow trickle or a gush. Let your health care provider know if it has a color or strange odor. If you have any of these signs, call your health care provider right away, even if it is before your due date. Follow these instructions at home: Medicines  Follow your health care provider's instructions regarding medicine use. Specific medicines may be either safe or unsafe to take during pregnancy.  Take a prenatal vitamin that contains at least 600 micrograms (mcg) of folic acid.  If you develop constipation, try taking a stool softener if your health care provider approves. Eating and drinking  Eat a balanced diet that includes fresh fruits and vegetables, whole grains, good sources of protein  such as meat, eggs, or tofu, and low-fat dairy. Your health care provider will help you determine the amount of weight gain that is right for you.  Avoid raw meat and uncooked cheese. These carry germs that can cause birth defects in the baby.  If you have low calcium intake from food, talk to your health care provider about whether you should take a daily calcium supplement.  Eat four or five small meals rather than three large meals a day.  Limit foods that are high in fat and processed sugars, such as fried and sweet foods.  To prevent constipation: ? Drink enough fluid to keep your urine clear or pale yellow. ? Eat foods that are high in fiber, such as fresh fruits and vegetables, whole grains, and beans. Activity  Exercise only as directed by your health care provider. Most women can continue their usual exercise routine during pregnancy. Try to exercise for 30 minutes at least 5 days a week. Stop exercising if you experience uterine contractions.  Avoid heavy   lifting.  Do not exercise in extreme heat or humidity, or at high altitudes.  Wear low-heel, comfortable shoes.  Practice good posture.  You may continue to have sex unless your health care provider tells you otherwise. Relieving pain and discomfort  Take frequent breaks and rest with your legs elevated if you have leg cramps or low back pain.  Take warm sitz baths to soothe any pain or discomfort caused by hemorrhoids. Use hemorrhoid cream if your health care provider approves.  Wear a good support bra to prevent discomfort from breast tenderness.  If you develop varicose veins: ? Wear support pantyhose or compression stockings as told by your healthcare provider. ? Elevate your feet for 15 minutes, 3-4 times a day. Prenatal care  Write down your questions. Take them to your prenatal visits.  Keep all your prenatal visits as told by your health care provider. This is important. Safety  Wear your seat belt at  all times when driving.  Make a list of emergency phone numbers, including numbers for family, friends, the hospital, and police and fire departments. General instructions  Avoid cat litter boxes and soil used by cats. These carry germs that can cause birth defects in the baby. If you have a cat, ask someone to clean the litter box for you.  Do not travel far distances unless it is absolutely necessary and only with the approval of your health care provider.  Do not use hot tubs, steam rooms, or saunas.  Do not drink alcohol.  Do not use any products that contain nicotine or tobacco, such as cigarettes and e-cigarettes. If you need help quitting, ask your health care provider.  Do not use any medicinal herbs or unprescribed drugs. These chemicals affect the formation and growth of the baby.  Do not douche or use tampons or scented sanitary pads.  Do not cross your legs for long periods of time.  To prepare for the arrival of your baby: ? Take prenatal classes to understand, practice, and ask questions about labor and delivery. ? Make a trial run to the hospital. ? Visit the hospital and tour the maternity area. ? Arrange for maternity or paternity leave through employers. ? Arrange for family and friends to take care of pets while you are in the hospital. ? Purchase a rear-facing car seat and make sure you know how to install it in your car. ? Pack your hospital bag. ? Prepare the baby's nursery. Make sure to remove all pillows and stuffed animals from the baby's crib to prevent suffocation.  Visit your dentist if you have not gone during your pregnancy. Use a soft toothbrush to brush your teeth and be gentle when you floss. Contact a health care provider if:  You are unsure if you are in labor or if your water has broken.  You become dizzy.  You have mild pelvic cramps, pelvic pressure, or nagging pain in your abdominal area.  You have lower back pain.  You have persistent  nausea, vomiting, or diarrhea.  You have an unusual or bad smelling vaginal discharge.  You have pain when you urinate. Get help right away if:  Your water breaks before 37 weeks.  You have regular contractions less than 5 minutes apart before 37 weeks.  You have a fever.  You are leaking fluid from your vagina.  You have spotting or bleeding from your vagina.  You have severe abdominal pain or cramping.  You have rapid weight loss or weight gain.    You have shortness of breath with chest pain.  You notice sudden or extreme swelling of your face, hands, ankles, feet, or legs.  Your baby makes fewer than 10 movements in 2 hours.  You have severe headaches that do not go away when you take medicine.  You have vision changes. Summary  The third trimester is from week 28 through week 40, months 7 through 9. The third trimester is a time when the unborn baby (fetus) is growing rapidly.  During the third trimester, your discomfort may increase as you and your baby continue to gain weight. You may have abdominal, leg, and back pain, sleeping problems, and an increased need to urinate.  During the third trimester your breasts will keep growing and they will continue to become tender. A yellow fluid (colostrum) may leak from your breasts. This is the first milk you are producing for your baby.  False labor is a condition in which you feel small, irregular tightenings of the muscles in the womb (contractions) that eventually go away. These are called Braxton Hicks contractions. Contractions may last for hours, days, or even weeks before true labor sets in.  Signs of labor can include: abdominal cramps; regular contractions that start at 10 minutes apart and become stronger and more frequent with time; watery or bloody mucus discharge that comes from the vagina; increased pelvic pressure and dull back pain; and leaking of amniotic fluid. This information is not intended to replace advice  given to you by your health care provider. Make sure you discuss any questions you have with your health care provider. Document Released: 01/24/2001 Document Revised: 07/08/2015 Document Reviewed: 04/02/2012 [employer] Interactive Patient Education  2017 [employer]  

## 2017-07-27 LAB — DRUG SCREEN, URINE
Amphetamines, Urine: NEGATIVE ng/mL
BARBITURATE SCREEN URINE: NEGATIVE ng/mL
BENZODIAZEPINE QUANT UR: NEGATIVE ng/mL
COCAINE (METAB.): POSITIVE ng/mL — AB
Cannabinoid Quant, Ur: NEGATIVE ng/mL
OPIATE QUANT UR: NEGATIVE ng/mL
PCP Quant, Ur: NEGATIVE ng/mL

## 2017-07-30 LAB — CULTURE, BETA STREP (GROUP B ONLY): Strep Gp B Culture: NEGATIVE

## 2017-07-31 ENCOUNTER — Other Ambulatory Visit: Payer: Self-pay

## 2017-07-31 ENCOUNTER — Encounter: Payer: Self-pay | Admitting: Emergency Medicine

## 2017-07-31 ENCOUNTER — Emergency Department
Admission: EM | Admit: 2017-07-31 | Discharge: 2017-07-31 | Disposition: A | Discharge status: Home / Self Care | Payer: Medicaid Other | Source: Home / Self Care | Attending: Emergency Medicine | Admitting: Emergency Medicine

## 2017-07-31 ENCOUNTER — Emergency Department
Admission: EM | Admit: 2017-07-31 | Discharge: 2017-07-31 | Disposition: A | Discharge status: Home / Self Care | Payer: Medicaid Other | Attending: Emergency Medicine | Admitting: Emergency Medicine

## 2017-07-31 DIAGNOSIS — R0982 Postnasal drip: Secondary | ICD-10-CM

## 2017-07-31 DIAGNOSIS — Z3A37 37 weeks gestation of pregnancy: Secondary | ICD-10-CM | POA: Diagnosis not present

## 2017-07-31 DIAGNOSIS — O99513 Diseases of the respiratory system complicating pregnancy, third trimester: Secondary | ICD-10-CM | POA: Diagnosis not present

## 2017-07-31 DIAGNOSIS — J029 Acute pharyngitis, unspecified: Secondary | ICD-10-CM

## 2017-07-31 DIAGNOSIS — R070 Pain in throat: Secondary | ICD-10-CM

## 2017-07-31 DIAGNOSIS — K1379 Other lesions of oral mucosa: Secondary | ICD-10-CM

## 2017-07-31 DIAGNOSIS — O9989 Other specified diseases and conditions complicating pregnancy, childbirth and the puerperium: Secondary | ICD-10-CM

## 2017-07-31 LAB — COMPREHENSIVE METABOLIC PANEL
ALBUMIN: 3.3 g/dL — AB (ref 3.5–5.0)
ALT: 10 U/L — ABNORMAL LOW (ref 14–54)
AST: 20 U/L (ref 15–41)
Alkaline Phosphatase: 89 U/L (ref 38–126)
Anion gap: 7 (ref 5–15)
BILIRUBIN TOTAL: 0.5 mg/dL (ref 0.3–1.2)
BUN: 6 mg/dL (ref 6–20)
CO2: 21 mmol/L — ABNORMAL LOW (ref 22–32)
CREATININE: 0.53 mg/dL (ref 0.44–1.00)
Calcium: 8.2 mg/dL — ABNORMAL LOW (ref 8.9–10.3)
Chloride: 106 mmol/L (ref 101–111)
GFR calc Af Amer: >60 mL/min (ref >60)
GLUCOSE: 79 mg/dL (ref 65–99)
POTASSIUM: 3.7 mmol/L (ref 3.5–5.1)
Sodium: 134 mmol/L — ABNORMAL LOW (ref 135–145)
TOTAL PROTEIN: 6.8 g/dL (ref 6.5–8.1)

## 2017-07-31 LAB — CBC WITH DIFFERENTIAL/PLATELET
BASOS ABS: 0 10*3/uL (ref 0–0.1)
BASOS PCT: 1 %
Eosinophils Absolute: 0.5 10*3/uL (ref 0–0.7)
Eosinophils Relative: 7 %
HEMATOCRIT: 33.9 % — AB (ref 35.0–47.0)
HEMOGLOBIN: 11.7 g/dL — AB (ref 12.0–16.0)
LYMPHS PCT: 22 %
Lymphs Abs: 1.4 10*3/uL (ref 1.0–3.6)
MCH: 31.2 pg (ref 26.0–34.0)
MCHC: 34.6 g/dL (ref 32.0–36.0)
MCV: 90.2 fL (ref 80.0–100.0)
MONO ABS: 0.5 10*3/uL (ref 0.2–0.9)
Monocytes Relative: 9 %
NEUTROS ABS: 4 10*3/uL (ref 1.4–6.5)
Neutrophils Relative %: 61 %
Platelets: 184 10*3/uL (ref 150–440)
RBC: 3.75 MIL/uL — AB (ref 3.80–5.20)
RDW: 13.2 % (ref 11.5–14.5)
WBC: 6.4 10*3/uL (ref 3.6–11.0)

## 2017-07-31 LAB — PROTIME-INR
INR: 0.99
PROTHROMBIN TIME: 13 s (ref 11.4–15.2)

## 2017-07-31 LAB — APTT: aPTT: 30 seconds (ref 24–36)

## 2017-07-31 MED ORDER — OXYMETAZOLINE HCL 0.05 % NA SOLN
1.0000 | Freq: Once | NASAL | Status: AC
  Administered 2017-07-31: 1 via NASAL
  Filled 2017-07-31: qty 15

## 2017-07-31 NOTE — ED Triage Notes (Signed)
Pt c/o sore throat for the past 2 days and this morning woke up with a blood clot in her mouth. Pt is [redacted] weeks pregnant.Marland Kitchen.denies any other sx at present.

## 2017-07-31 NOTE — Discharge Instructions (Signed)
Follow up with your regular doctor.  Return to the ER if worsening

## 2017-07-31 NOTE — ED Triage Notes (Signed)
Arrives via [facility].  Seen through ED earlier today for same complaint, of waking up with "blood clots" in the back of her throat.  VS WNL.  NAD

## 2017-07-31 NOTE — ED Notes (Signed)
See triage note  Presents with couple day hx of sore throat  No fever  But this am she noticed a blood clot in her mouth  Denies any cough  Pt is also 17 weeks preg  Denies any vaginal bleeding  FHT  160

## 2017-07-31 NOTE — ED Provider Notes (Signed)
[facility] Emergency Department Provider Note  ____________________________________________   None    (approximate)  I have reviewed the triage vital signs and the nursing notes.   HISTORY  Chief Complaint Sore Throat    HPI Carolyn Petersen is a 27 y.o. female presents emergency department complaining of sore throat, no fever, but did notice to blood clots in her mouth this morning.  Patient has [redacted] weeks pregnant.  She denies any vaginal bleeding or cramping.  She states that her  gums also believed while brushing her teeth.  She denies any other bruising.  History reviewed. No pertinent past medical history.  Patient Active Problem List   Diagnosis Date Noted  . Poor nutritional intake affecting pregnancy in second trimester 03/26/2017  . History of prior pregnancy with IUGR newborn 03/26/2017  . Supervision of high risk pregnancy in second trimester 03/26/2017    History reviewed. No pertinent surgical history.  Prior to Admission medications   Medication Sig Start Date End Date Taking? Authorizing Provider  Nutritional Supplements (ENSURE HEALTHY MOM) LIQD Take 1 Bottle by mouth daily. 03/26/17   Rexene Agent, CNM    Allergies Patient has no known allergies.  No family history on file.  Social History Social History   Tobacco Use  . Smoking status: Never Smoker  . Smokeless tobacco: Never Used  Substance Use Topics  . Alcohol use: No  . Drug use: No    Review of Systems  Constitutional: No fever/chills Eyes: No visual changes. ENT: Positive sore throat.  Positive for blood clots in the mouth Respiratory: Denies cough Genitourinary: Negative for dysuria. Musculoskeletal: Negative for back pain. Skin: Negative for rash.    ____________________________________________   PHYSICAL EXAM:  VITAL SIGNS: ED Triage Vitals [07/31/17 0721]  Enc Vitals Group     BP 104/74     Pulse Rate 93     Resp 16     Temp 98.2 F  (36.8 C)     Temp Source Oral     SpO2 99 %     Weight 143 lb (64.9 kg)     Height _0  (1.575 m)     Head Circumference      Peak Flow      Pain Score 5     Pain Loc      Pain Edu?      Excl. in [place]?     Constitutional: Alert and oriented. Well appearing and in no acute distress. Eyes: Conjunctivae are normal.  Head: Atraumatic. Nose: No congestion/rhinnorhea. Mouth/Throat: Mucous membranes are moist.  No redness in the pharynx.  No active bleeding at the gums.  No bruising or lesions within the mouth and throat. Neck: Supple, no lymphadenopathy is noted Cardiovascular: Normal rate, regular rhythm. Respiratory: Normal respiratory effort.  No retractions GU: deferred Musculoskeletal: FROM all extremities, warm and well perfused Neurologic:  Normal speech and language.  Skin:  Skin is warm, dry and intact. No rash noted. Psychiatric: Mood and affect are normal. Speech and behavior are normal.  ____________________________________________   LABS (all labs ordered are listed, but only abnormal results are displayed)  Labs Reviewed  CBC WITH DIFFERENTIAL/PLATELET - Abnormal; Notable for the following components:      Result Value   RBC 3.75 (*)    Hemoglobin 11.7 (*)    HCT 33.9 (*)    All other components within normal limits  COMPREHENSIVE METABOLIC PANEL - Abnormal; Notable for the following components:   Sodium 134 (*)  CO2 21 (*)    Calcium 8.2 (*)    Albumin 3.3 (*)    ALT 10 (*)    All other components within normal limits  PROTIME-INR  APTT   ____________________________________________   ____________________________________________  RADIOLOGY    ____________________________________________   PROCEDURES  Procedure(s) performed: No  Procedures    ____________________________________________   INITIAL IMPRESSION / ASSESSMENT AND PLAN / ED COURSE  Pertinent labs & imaging results that were available during my care of the patient were  reviewed by me and considered in my medical decision making (see chart for details).  Patient is a 27 year old female presents emergency department complaining of seeing blood clots in her mouth this morning.  She is complaining of sore throat.  She denies any fever chills.  Patient is [redacted] weeks pregnant.  On physical exam patient appears well the exam is unremarkable.  Due to the patient's 37-week pregnancy labs were performed to rule out any clotting disorder.  CBC is normal.  Met C is normal.  PT and PTT were both normal.  Test results were discussed with the patient and her family.  She was encouraged to follow-up with her regular doctor.  Explained her she does not need an antibiotic at this time.  She states she understands to comply with our instructions.  She see her GYN doctor soon as possible.  She discharged in stable condition.     As part of my medical decision making, I reviewed the following data within the [place] notes reviewed and incorporated, Labs reviewed CBC, met C, PT and PTT are all normal, Old chart reviewed, Notes from prior ED visits and [place] Controlled Substance Database  ____________________________________________   FINAL CLINICAL IMPRESSION(S) / ED DIAGNOSES  Final diagnoses:  Post-nasal drip  Bleeding in mouth      NEW MEDICATIONS STARTED DURING THIS VISIT:  Discharge Medication List as of 07/31/2017  9:08 AM       Note:  This document was prepared using Dragon voice recognition software and may include unintentional dictation errors.    Versie Starks, PA-C 07/31/17 1023    Schaevitz, Randall An, MD 07/31/17 [id number]

## 2017-07-31 NOTE — ED Notes (Addendum)
Pt in NAD at time of departure, VSS, pt ambualtory. Pt left without receiving discharge paperwork

## 2017-07-31 NOTE — ED Provider Notes (Signed)
[facility] Emergency Department Provider Note  ____________________________________________  Time seen: Approximately 4:48 PM  I have reviewed the triage vital signs and the nursing notes.   HISTORY  Chief Complaint Sore Throat   HPI Carolyn Petersen is a 27 y.o. female with no significant past medical history currently at [redacted] weeks gestational age who presents for evaluation of sore throat.  Patient reports that she has had nasal congestion and a mild constant sore throat for the last week.  This morning when she woke up she noticed a blood clot in her mouth. After taking a nap this afternoon she again woke up with a blood clot in her mouth.  She reports intermittent blood streaks in her nasal discharge over the last few days.  She has had no fever or chills, no trouble swallowing, no trouble speaking, no trouble breathing.  She is not on blood thinners.  No history of smoking or family history of oropharyngeal cancer.  Patient has had no pregnancy related complications, no prior history of preeclampsia, no elevated blood pressure, no headache, no abdominal pain, no vaginal bleeding or contractions.  Patient Active Problem List   Diagnosis Date Noted  . Poor nutritional intake affecting pregnancy in second trimester 03/26/2017  . History of prior pregnancy with IUGR newborn 03/26/2017  . Supervision of high risk pregnancy in second trimester 03/26/2017    History reviewed. No pertinent surgical history.  Prior to Admission medications   Medication Sig Start Date End Date Taking? Authorizing Provider  Nutritional Supplements (ENSURE HEALTHY MOM) LIQD Take 1 Bottle by mouth daily. 03/26/17   Oswaldo Conroy, CNM    Allergies Patient has no known allergies.  No family history on file.  Social History Social History   Tobacco Use  . Smoking status: Never Smoker  . Smokeless tobacco: Never Used  Substance Use Topics  . Alcohol use: No  . Drug  use: No    Review of Systems  Constitutional: Negative for fever. Eyes: Negative for visual changes. ENT: + sore throat and congestion Neck: No neck pain  Cardiovascular: Negative for chest pain. Respiratory: Negative for shortness of breath. Gastrointestinal: Negative for abdominal pain, vomiting or diarrhea. Genitourinary: Negative for dysuria. Musculoskeletal: Negative for back pain. Skin: Negative for rash. Neurological: Negative for headaches, weakness or numbness. Psych: No SI or HI  ____________________________________________   PHYSICAL EXAM:  VITAL SIGNS: ED Triage Vitals  Enc Vitals Group     BP 07/31/17 1630 (!) 113/55     Pulse Rate 07/31/17 1630 78     Resp 07/31/17 1630 12     Temp 07/31/17 1630 98.7 F (37.1 C)     Temp Source 07/31/17 1630 Oral     SpO2 07/31/17 1630 96 %     Weight 07/31/17 1625 143 lb (64.9 kg)     Height 07/31/17 1625 5\' 2"  (1.575 m)     Head Circumference --      Peak Flow --      Pain Score 07/31/17 1625 0     Pain Loc --      Pain Edu? --      Excl. in GC? --     Constitutional: Alert and oriented. Well appearing and in no apparent distress. HEENT:      Head: Normocephalic and atraumatic.         Eyes: Conjunctivae are normal. Sclera is non-icteric. EOMI. PERRL      Ears: Tympanic membranes visualized and clear. No discharge  or pus present.      Nose: No congestion. Sinuses are non tender. No signs of epistaxis      Mouth/Throat: Mucous membranes are moist. Oropharynx with no exudates or erythema, no abscess or mass, no signs of bleeding      Neck: Supple with no signs of meningismus. No stridor. Hematological/Lymphatic/Immunilogical: No cervical lymphadenopathy. Cardiovascular: Regular rate and rhythm. No murmurs, gallops, or rubs. 2+ symmetrical distal pulses are present in all extremities. No JVD. Respiratory: Normal respiratory effort. Lungs are clear to auscultation bilaterally. No wheezes, crackles, or rhonchi.    Gastrointestinal: Soft, non tender, and non distended with positive bowel sounds. No rebound or guarding. Neurologic: Normal speech and language. Face is symmetric. Moving all extremities. No gross focal neurologic deficits are appreciated. Skin: Skin is warm, dry and intact. No rash noted. Psychiatric: Mood and affect are normal. Speech and behavior are normal.  ____________________________________________   LABS (all labs ordered are listed, but only abnormal results are displayed)  Labs Reviewed - No data to display ____________________________________________  EKG  none  ____________________________________________  RADIOLOGY  none  ____________________________________________   PROCEDURES  Procedure(s) performed: None Procedures Critical Care performed:  None ____________________________________________   INITIAL IMPRESSION / ASSESSMENT AND PLAN / ED COURSE   27 y.o. female with no significant past medical history currently at 8137 weeks gestational age who presents for evaluation of sore throat, nasal congestion and two blood clots that appeared in her mouth after waking up today.  Patient's exam is within normal limits, normal oropharynx with no exudates, no peritonsillar abscess, erythema or swelling, no active bleeding, no intraoral lesions or lacerations, nasal cavity with no evidence of active bleeding or dried blood.  Discussed with Dr. Jenne CampusMcqueen from ENT who recommended putting patient on Afrin 3 times a day as this is most likely coming from nasopharynx for the next 24 hours and if the bleeding recurs to call him first thing in the morning for a scope.  Patient is hemodynamically stable, there is no airway compromise, she is not on blood thinners or has no history of bleeding disorders.  I believe patient is safe for discharge at this time. Discussed return precautions with patient.       As part of my medical decision making, I reviewed the following data within  the electronic MEDICAL RECORD NUMBER Nursing notes reviewed and incorporated, Old chart reviewed, A consult was requested and obtained from this/these consultant(s) ENT, Notes from prior ED visits and [place] Controlled Substance Database    Pertinent labs & imaging results that were available during my care of the patient were reviewed by me and considered in my medical decision making (see chart for details).    ____________________________________________   FINAL CLINICAL IMPRESSION(S) / ED DIAGNOSES  Final diagnoses:  Sore throat  Post-nasal drip      NEW MEDICATIONS STARTED DURING THIS VISIT:  ED Discharge Orders    None       Note:  This document was prepared using Dragon voice recognition software and may include unintentional dictation errors.    Don PerkingVeronese, [place], MD 07/31/17 [phone]

## 2017-07-31 NOTE — Discharge Instructions (Addendum)
Apply one squirt of afrin in each nare three times a day. If the bleeding recurs call [facility] first thing in the am for follow up.

## 2017-08-02 ENCOUNTER — Ambulatory Visit (INDEPENDENT_AMBULATORY_CARE_PROVIDER_SITE_OTHER): Payer: Medicaid Other | Admitting: Obstetrics and Gynecology

## 2017-08-02 ENCOUNTER — Encounter: Payer: Self-pay | Admitting: Obstetrics and Gynecology

## 2017-08-02 ENCOUNTER — Ambulatory Visit (INDEPENDENT_AMBULATORY_CARE_PROVIDER_SITE_OTHER): Payer: Medicaid Other

## 2017-08-02 VITALS — BP 102/62 | Wt 147.0 lb

## 2017-08-02 DIAGNOSIS — O0993 Supervision of high risk pregnancy, unspecified, third trimester: Secondary | ICD-10-CM

## 2017-08-02 DIAGNOSIS — Z8759 Personal history of other complications of pregnancy, childbirth and the puerperium: Secondary | ICD-10-CM

## 2017-08-02 DIAGNOSIS — E639 Nutritional deficiency, unspecified: Secondary | ICD-10-CM

## 2017-08-02 DIAGNOSIS — Z3A36 36 weeks gestation of pregnancy: Secondary | ICD-10-CM

## 2017-08-02 DIAGNOSIS — O99282 Endocrine, nutritional and metabolic diseases complicating pregnancy, second trimester: Secondary | ICD-10-CM

## 2017-08-02 DIAGNOSIS — O0992 Supervision of high risk pregnancy, unspecified, second trimester: Secondary | ICD-10-CM

## 2017-08-02 DIAGNOSIS — O99283 Endocrine, nutritional and metabolic diseases complicating pregnancy, third trimester: Secondary | ICD-10-CM

## 2017-08-02 DIAGNOSIS — Z3A37 37 weeks gestation of pregnancy: Secondary | ICD-10-CM

## 2017-08-02 NOTE — Progress Notes (Incomplete)
Routine Prenatal Care Visit  Subjective  Carolyn Petersen is a 27 y.o. G5P4004 at 7232w4d being seen today for ongoing prenatal care.  She is currently monitored for the following issues for this {Blank single:19197::"high-risk","low-risk"} pregnancy and has Poor nutritional intake affecting pregnancy in second trimester; History of prior pregnancy with IUGR newborn; and Supervision of high risk pregnancy in second trimester on their problem list.  ----------------------------------------------------------------------------------- Patient reports {sx:14538}.   Contractions: Irregular. Vag. Bleeding: None.  Movement: Present. Denies leaking of fluid.  ----------------------------------------------------------------------------------- The following portions of the patient's history were reviewed and updated as appropriate: allergies, current medications, past family history, past medical history, past social history, past surgical history and problem list. Problem list updated.   Objective  Blood pressure 102/62, weight 147 lb (66.7 kg), last menstrual period 11/12/2016, unknown if currently breastfeeding. Pregravid weight 140 lb (63.5 kg) Total Weight Gain 7 lb (3.175 kg) Urinalysis:      Fetal Status: Fetal Heart Rate (bpm): 160   Movement: Present  Presentation: Vertex  General:  Alert, oriented and cooperative. Patient is in no acute distress.  Skin: Skin is warm and dry. No rash noted.   Cardiovascular: Normal heart rate noted  Respiratory: Normal respiratory effort, no problems with respiration noted  Abdomen: Soft, gravid, appropriate for gestational age. Pain/Pressure: Present     Pelvic:  {Blank single:19197::"Cervical exam performed","Cervical exam deferred"} Dilation: 1 Effacement (%): 40 Station: -3  Extremities: Normal range of motion.     Mental Status: Normal mood and affect. Normal behavior. Normal judgment and thought content.   Assessment   27 y.o. [id number] at 6532w4d by   08/19/2017, by Last Menstrual Period presenting for {Blank single:19197::"routine","work-in"} prenatal visit  Plan   pregnancy Problems (from 02/16/17 to present)    Problem Noted Resolved   History of prior pregnancy with IUGR newborn 03/26/2017 by Oswaldo ConroySchmid, Jacelyn Y, CNM No   Supervision of high risk pregnancy in second trimester 03/26/2017 by Oswaldo ConroySchmid, Jacelyn Y, CNM No   Overview Addendum 03/26/2017 10:56 AM by Oswaldo ConroySchmid, Jacelyn Y, CNM    Clinic [facility] Prenatal Labs  Dating L=6 Blood type: O/Positive/-- (01/04 1417)   Genetic Screen 1 Screen:    AFP:     Quad:     NIPS: Antibody:Negative (01/04 1417)  Anatomic US  Rubella: 3.19 (01/04 1417) Varicella: Immune  GTT Early:               Third trimester:  RPR: Non Reactive (01/04 1417)   Rhogam  HBsAg: Negative (01/04 1417)   TDaP vaccine                       Flu Shot: HIV: Non Reactive (01/04 1417)   Baby Food                                GBS:   Contraception  Pap: ASCUS/HPV Pos, needs colpo  CBB     CS/VBAC    Support Person                  {Blank single:19197::"Term","Preterm"} labor symptoms and general obstetric precautions including but not limited to vaginal bleeding, contractions, leaking of fluid and fetal movement were reviewed in detail with the patient. Please refer to After Visit Summary for other counseling recommendations.   Return in about 1 week (around 08/09/2017) for Routine Prenatal Appointment.  Thomasene MohairStephen Romeka Scifres, MD, [facility]AC[facility], [facility] 08/02/2017 11:54 AM

## 2017-08-02 NOTE — Progress Notes (Signed)
Routine Prenatal Care Visit  Subjective  Carolyn Petersen is a 27 y.o. G5P4004 at 454w4d being seen today for ongoing prenatal care.  She is currently monitored for the following issues for this high-risk pregnancy and has Poor nutritional intake affecting pregnancy in second trimester; History of prior pregnancy with IUGR newborn; and Supervision of high risk pregnancy in second trimester on their problem list.  ----------------------------------------------------------------------------------- Patient reports bleeding from her mouth after lying down. Rare blood in nose, but no clots by mouth. No nausea, emesis and ongoing bleeding. Evaluation in ER did not reveal a possible source.  Contractions: Irregular. Vag. Bleeding: None.  Movement: Present. Denies leaking of fluid.  Ultrasound today shows growth in 44th %ile.  FHR 180s.  Recheck, as below at 160.   ----------------------------------------------------------------------------------- The following portions of the patient's history were reviewed and updated as appropriate: allergies, current medications, past family history, past medical history, past social history, past surgical history and problem list. Problem list updated.  Objective  Blood pressure 102/62, weight 147 lb (66.7 kg), last menstrual period 11/12/2016, unknown if currently breastfeeding. Pregravid weight 140 lb (63.5 kg) Total Weight Gain 7 lb (3.175 kg) Urinalysis:      Fetal Status: Fetal Heart Rate (bpm): 160   Movement: Present  Presentation: Vertex  General:  Alert, oriented and cooperative. Patient is in no acute distress.  Skin: Skin is warm and dry. No rash noted.   Cardiovascular: Normal heart rate noted  Respiratory: Normal respiratory effort, no problems with respiration noted  Abdomen: Soft, gravid, appropriate for gestational age. Pain/Pressure: Present     Pelvic:  Cervical exam performed Dilation: 1 Effacement (%): 40 Station: -3  Extremities: Normal  range of motion.     Mental Status: Normal mood and affect. Normal behavior. Normal judgment and thought content.   Female chaperone present for pelvic exam:   [place] [facility] Follow Up  Result Date: 08/02/2017 Patient Name: Carolyn Petersen DOB: 04/22/1990 MRN: [id number] ULTRASOUND REPORT Location: [facility] Date of Service: 08/02/2017 Indications:growth/afi Findings: Mason JimSingleton intrauterine pregnancy is visualized with FHR at 180 BPM (maximum normal). Biometrics give an (U/S) Gestational age of 5679w0d and an (U/S) EDD of 08/23/17; this correlates with the clinically established Estimated Date of Delivery: 08/19/17. Fetal presentation is Cephalic. Placenta: posterior grade 2. AFI: 8.03 cm Growth percentile is 44%. EFW: 6lb13oz (3,083 grams) Impression: 1. 184w4d Viable Singleton Intrauterine pregnancy previously established criteria. 2. Growth is 44 %ile.  AFI is 8.03 cm. Recommendations: 1.Clinical correlation with the patient's History and Physical Exam. Abeer Alsammarraie RDMS The ultrasound images and findings were reviewed by me and I agree with the above report. Thomasene MohairStephen Irby Fails, MD, Merlinda FrederickFACOG [facility], [facility] 08/02/2017 11:42 AM    Assessment   27 y.o. [id number] at 524w4d by  08/19/2017, by Last Menstrual Period presenting for routine prenatal visit  Plan   pregnancy Problems (from 02/16/17 to present)    Problem Noted Resolved   History of prior pregnancy with IUGR newborn 03/26/2017 by Oswaldo ConroySchmid, Jacelyn Y, CNM No   Supervision of high risk pregnancy in second trimester 03/26/2017 by Oswaldo ConroySchmid, Jacelyn Y, CNM No   Overview Addendum 03/26/2017 10:56 AM by Oswaldo ConroySchmid, Jacelyn Y, CNM    Clinic [facility] Prenatal Labs  Dating L=6 Blood type: O/Positive/-- (01/04 1417)   Genetic Screen 1 Screen:    AFP:     Quad:     NIPS: Antibody:Negative (01/04 1417)  Anatomic US  Rubella: 3.19 (01/04 1417) Varicella: Immune  GTT Early:  Third trimester:  RPR: Non Reactive (01/04 1417)   Rhogam   HBsAg: Negative (01/04 1417)   TDaP vaccine                       Flu Shot: HIV: Non Reactive (01/04 1417)   Baby Food                                GBS:   Contraception  Pap: ASCUS/HPV Pos, needs colpo  CBB     CS/VBAC    Support Person                  Term labor symptoms and general obstetric precautions including but not limited to vaginal bleeding, contractions, leaking of fluid and fetal movement were reviewed in detail with the patient. Please refer to After Visit Summary for other counseling recommendations.   - discussed u/s findings.  - low threshold to go to L&D for labor symptoms given parity.  - continue to monitor bleeding from nose/mouth. History of cocaine use. So, could be related to that. Recent positive test for cocaine.  Risks of use discussed. Testing on admission.   Return in about 1 week (around 08/09/2017) for Routine Prenatal Appointment.  Thomasene Mohair, MD, [facility], [facility] 08/02/2017 11:54 AM

## 2017-08-09 ENCOUNTER — Ambulatory Visit (INDEPENDENT_AMBULATORY_CARE_PROVIDER_SITE_OTHER): Payer: Medicaid Other | Admitting: Advanced Practice Midwife

## 2017-08-09 ENCOUNTER — Encounter: Payer: Self-pay | Admitting: Advanced Practice Midwife

## 2017-08-09 VITALS — BP 104/64 | Wt 148.0 lb

## 2017-08-09 DIAGNOSIS — Z3A38 38 weeks gestation of pregnancy: Secondary | ICD-10-CM

## 2017-08-09 DIAGNOSIS — O09293 Supervision of pregnancy with other poor reproductive or obstetric history, third trimester: Secondary | ICD-10-CM

## 2017-08-09 NOTE — Progress Notes (Signed)
  Routine Prenatal Care Visit  Subjective  Carolyn Petersen is a 27 y.o. G5P4004 at 6173w4d being seen today for ongoing prenatal care.  She is currently monitored for the following issues for this high-risk pregnancy and has Poor nutritional intake affecting pregnancy in second trimester; History of prior pregnancy with IUGR newborn; and Supervision of high risk pregnancy in second trimester on their problem list.  ----------------------------------------------------------------------------------- Patient reports hip pain/pressure when walking.   Contractions: Irregular. Vag. Bleeding: None.  Movement: Present. Denies leaking of fluid.  ----------------------------------------------------------------------------------- The following portions of the patient's history were reviewed and updated as appropriate: allergies, current medications, past family history, past medical history, past social history, past surgical history and problem list. Problem list updated.   Objective  Blood pressure 104/64, weight 148 lb (67.1 kg), last menstrual period 11/12/2016, unknown if currently breastfeeding. Pregravid weight 140 lb (63.5 kg) Total Weight Gain 8 lb (3.629 kg) Urinalysis:      Fetal Status: Fetal Heart Rate (bpm): 161 Fundal Height: 36 cm Movement: Present  Presentation: Vertex  General:  Alert, oriented and cooperative. Patient is in no acute distress.  Skin: Skin is warm and dry. No rash noted.   Cardiovascular: Normal heart rate noted  Respiratory: Normal respiratory effort, no problems with respiration noted  Abdomen: Soft, gravid, appropriate for gestational age. Pain/Pressure: Present     Pelvic:  Cervical exam performed Dilation: 2.5 Effacement (%): 50 Station: -3  Extremities: Normal range of motion.  Edema: None  Mental Status: Normal mood and affect. Normal behavior. Normal judgment and thought content.   Assessment   27 y.o. [id number] at 5173w4d by  08/19/2017, by Last Menstrual  Period presenting for routine prenatal visit  Plan   pregnancy Problems (from 02/16/17 to present)    Problem Noted Resolved   History of prior pregnancy with IUGR newborn 03/26/2017 by Oswaldo ConroySchmid, Jacelyn Y, CNM No   Supervision of high risk pregnancy in second trimester 03/26/2017 by Oswaldo ConroySchmid, Jacelyn Y, CNM No   Overview Addendum 03/26/2017 10:56 AM by Oswaldo ConroySchmid, Jacelyn Y, CNM    [facility]inic [facility] Prenatal Labs  Dating L=6 Blood type: O/Positive/-- (01/04 1417)   Genetic Screen 1 Screen:    AFP:     Quad:     NIPS: Antibody:Negative (01/04 1417)  Anatomic US  Rubella: 3.19 (01/04 1417) Varicella: Immune  GTT Early:               Third trimester:  RPR: Non Reactive (01/04 1417)   Rhogam  HBsAg: Negative (01/04 1417)   TDaP vaccine                       Flu Shot: HIV: Non Reactive (01/04 1417)   Baby Food                                GBS:   Contraception  Pap: ASCUS/HPV Pos, needs colpo  CBB     CS/VBAC    Support Person                  Term labor symptoms and general obstetric precautions including but not limited to vaginal bleeding, contractions, leaking of fluid and fetal movement were reviewed in detail with the patient.   Return in about 1 week (around 08/16/2017) for rob.  Tresea MallJane Ellery Tash, CNM 08/09/2017 10:57 AM

## 2017-08-12 ENCOUNTER — Encounter
Admission: EM | Disposition: A | Discharge status: Home / Self Care | Payer: Self-pay | Source: Home / Self Care | Attending: Obstetrics and Gynecology

## 2017-08-12 ENCOUNTER — Inpatient Hospital Stay: Payer: Medicaid Other | Admitting: Certified Registered"

## 2017-08-12 ENCOUNTER — Encounter: Payer: Self-pay | Admitting: *Deleted

## 2017-08-12 ENCOUNTER — Other Ambulatory Visit: Payer: Self-pay

## 2017-08-12 ENCOUNTER — Inpatient Hospital Stay: Payer: Medicaid Other

## 2017-08-12 ENCOUNTER — Inpatient Hospital Stay
Admission: EM | Admit: 2017-08-12 | Discharge: 2017-08-14 | DRG: 787 | Disposition: A | Discharge status: Home / Self Care | Payer: Medicaid Other | Attending: Obstetrics and Gynecology | Admitting: Obstetrics and Gynecology

## 2017-08-12 DIAGNOSIS — Z8759 Personal history of other complications of pregnancy, childbirth and the puerperium: Secondary | ICD-10-CM

## 2017-08-12 DIAGNOSIS — O9081 Anemia of the puerperium: Secondary | ICD-10-CM | POA: Diagnosis not present

## 2017-08-12 DIAGNOSIS — D62 Acute posthemorrhagic anemia: Secondary | ICD-10-CM | POA: Diagnosis not present

## 2017-08-12 DIAGNOSIS — O4693 Antepartum hemorrhage, unspecified, third trimester: Secondary | ICD-10-CM | POA: Diagnosis present

## 2017-08-12 DIAGNOSIS — O99324 Drug use complicating childbirth: Secondary | ICD-10-CM | POA: Diagnosis present

## 2017-08-12 DIAGNOSIS — Z3A39 39 weeks gestation of pregnancy: Secondary | ICD-10-CM

## 2017-08-12 DIAGNOSIS — O0992 Supervision of high risk pregnancy, unspecified, second trimester: Secondary | ICD-10-CM

## 2017-08-12 DIAGNOSIS — O4593 Premature separation of placenta, unspecified, third trimester: Principal | ICD-10-CM | POA: Diagnosis present

## 2017-08-12 DIAGNOSIS — F1490 Cocaine use, unspecified, uncomplicated: Secondary | ICD-10-CM | POA: Diagnosis present

## 2017-08-12 DIAGNOSIS — T81509A Unspecified complication of foreign body accidentally left in body following unspecified procedure, initial encounter: Secondary | ICD-10-CM

## 2017-08-12 HISTORY — DX: Other specified health status: Z78.9

## 2017-08-12 LAB — URINE DRUG SCREEN, QUALITATIVE (ARMC ONLY)
Amphetamines, Ur Screen: NOT DETECTED
BENZODIAZEPINE, UR SCRN: NOT DETECTED
CANNABINOID 50 NG, UR ~~LOC~~: NOT DETECTED
COCAINE METABOLITE, UR ~~LOC~~: NOT DETECTED
MDMA (Ecstasy)Ur Screen: NOT DETECTED
METHADONE SCREEN, URINE: NOT DETECTED
OPIATE, UR SCREEN: NOT DETECTED
PHENCYCLIDINE (PCP) UR S: NOT DETECTED
Tricyclic, Ur Screen: NOT DETECTED

## 2017-08-12 LAB — CBC
HCT: 35.8 % (ref 35.0–47.0)
Hemoglobin: 12.4 g/dL (ref 12.0–16.0)
MCH: 31.1 pg (ref 26.0–34.0)
MCHC: 34.7 g/dL (ref 32.0–36.0)
MCV: 89.7 fL (ref 80.0–100.0)
Platelets: 196 K/uL (ref 150–440)
RBC: 3.99 MIL/uL (ref 3.80–5.20)
RDW: 13.7 % (ref 11.5–14.5)
WBC: 6.2 K/uL (ref 3.6–11.0)

## 2017-08-12 LAB — RAPID HIV SCREEN (HIV 1/2 AB+AG)
HIV 1/2 Antibodies: NONREACTIVE
HIV-1 P24 Antigen - HIV24: NONREACTIVE

## 2017-08-12 LAB — TYPE AND SCREEN
ABO/RH(D): O POS
ANTIBODY SCREEN: NEGATIVE

## 2017-08-12 SURGERY — Surgical Case
Anesthesia: General | Site: Abdomen | Wound class: Clean Contaminated

## 2017-08-12 MED ORDER — FLEET ENEMA 7-19 GM/118ML RE ENEM: 1.0000 | ENEMA | Freq: Every day | RECTAL | Status: DC | PRN

## 2017-08-12 MED ORDER — ACETAMINOPHEN 325 MG PO TABS
650.0000 mg | ORAL_TABLET | ORAL | Status: DC | PRN
  Administered 2017-08-12: 650 mg via ORAL
  Filled 2017-08-12: qty 2

## 2017-08-12 MED ORDER — LIDOCAINE HCL (CARDIAC) PF 100 MG/5ML IV SOSY
PREFILLED_SYRINGE | INTRAVENOUS | Status: DC | PRN
  Administered 2017-08-12: 60 mg via INTRAVENOUS

## 2017-08-12 MED ORDER — DEXAMETHASONE SODIUM PHOSPHATE 10 MG/ML IJ SOLN
INTRAMUSCULAR | Status: AC
  Filled 2017-08-12: qty 1

## 2017-08-12 MED ORDER — MENTHOL 3 MG MT LOZG
1.0000 | LOZENGE | OROMUCOSAL | Status: DC | PRN
  Administered 2017-08-13: 3 mg via ORAL
  Filled 2017-08-12 (×2): qty 9

## 2017-08-12 MED ORDER — ZOLPIDEM TARTRATE 5 MG PO TABS: 5.0000 mg | ORAL_TABLET | Freq: Every evening | ORAL | Status: DC | PRN

## 2017-08-12 MED ORDER — CEFAZOLIN SODIUM-DEXTROSE 2-4 GM/100ML-% IV SOLN
2.0000 g | INTRAVENOUS | Status: DC
  Filled 2017-08-12: qty 100

## 2017-08-12 MED ORDER — OXYCODONE-ACETAMINOPHEN 5-325 MG PO TABS
1.0000 | ORAL_TABLET | ORAL | Status: DC | PRN
  Administered 2017-08-14: 1 via ORAL
  Filled 2017-08-12: qty 1

## 2017-08-12 MED ORDER — SOD CITRATE-CITRIC ACID 500-334 MG/5ML PO SOLN
ORAL | Status: AC
  Administered 2017-08-12: 30 mL via ORAL
  Filled 2017-08-12: qty 15

## 2017-08-12 MED ORDER — GLYCOPYRROLATE 0.2 MG/ML IJ SOLN
INTRAMUSCULAR | Status: AC
  Filled 2017-08-12: qty 2

## 2017-08-12 MED ORDER — SENNOSIDES-DOCUSATE SODIUM 8.6-50 MG PO TABS
2.0000 | ORAL_TABLET | ORAL | Status: DC
  Administered 2017-08-12 – 2017-08-13 (×2): 2 via ORAL
  Filled 2017-08-12 (×2): qty 2

## 2017-08-12 MED ORDER — PROPOFOL 10 MG/ML IV BOLUS
INTRAVENOUS | Status: DC | PRN
  Administered 2017-08-12: 200 mg via INTRAVENOUS

## 2017-08-12 MED ORDER — OXYTOCIN 40 UNITS IN LACTATED RINGERS INFUSION - SIMPLE MED
INTRAVENOUS | Status: AC
  Filled 2017-08-12: qty 1000

## 2017-08-12 MED ORDER — FENTANYL CITRATE (PF) 100 MCG/2ML IJ SOLN
25.0000 ug | INTRAMUSCULAR | Status: DC | PRN
  Administered 2017-08-12 (×2): 25 ug via INTRAVENOUS

## 2017-08-12 MED ORDER — OXYTOCIN 40 UNITS IN LACTATED RINGERS INFUSION - SIMPLE MED
1.0000 m[IU]/min | INTRAVENOUS | Status: DC
  Administered 2017-08-12: 40 mL via INTRAVENOUS
  Administered 2017-08-12: 1 m[IU]/min via INTRAVENOUS

## 2017-08-12 MED ORDER — PROMETHAZINE HCL 25 MG/ML IJ SOLN: 6.2500 mg | INTRAMUSCULAR | Status: DC | PRN

## 2017-08-12 MED ORDER — MISOPROSTOL 200 MCG PO TABS
ORAL_TABLET | ORAL | Status: AC
  Filled 2017-08-12: qty 4

## 2017-08-12 MED ORDER — COCONUT OIL OIL: 1.0000 "application " | TOPICAL_OIL | Status: DC | PRN

## 2017-08-12 MED ORDER — SIMETHICONE 80 MG PO CHEW
80.0000 mg | CHEWABLE_TABLET | Freq: Three times a day (TID) | ORAL | Status: DC
  Administered 2017-08-12 – 2017-08-14 (×5): 80 mg via ORAL
  Filled 2017-08-12 (×5): qty 1

## 2017-08-12 MED ORDER — LACTATED RINGERS IV SOLN
INTRAVENOUS | Status: DC
  Administered 2017-08-12: 11:00:00 via INTRAVENOUS

## 2017-08-12 MED ORDER — ONDANSETRON HCL 4 MG/2ML IJ SOLN
INTRAMUSCULAR | Status: DC | PRN
  Administered 2017-08-12: 4 mg via INTRAVENOUS

## 2017-08-12 MED ORDER — PHENYLEPHRINE HCL 10 MG/ML IJ SOLN
INTRAMUSCULAR | Status: DC | PRN
  Administered 2017-08-12 (×3): 100 ug via INTRAVENOUS

## 2017-08-12 MED ORDER — SOD CITRATE-CITRIC ACID 500-334 MG/5ML PO SOLN: 30.0000 mL | ORAL | Status: DC

## 2017-08-12 MED ORDER — SOD CITRATE-CITRIC ACID 500-334 MG/5ML PO SOLN
30.0000 mL | ORAL | Status: DC | PRN
  Administered 2017-08-12: 30 mL via ORAL

## 2017-08-12 MED ORDER — IBUPROFEN 600 MG PO TABS: 600.0000 mg | ORAL_TABLET | Freq: Four times a day (QID) | ORAL | Status: DC

## 2017-08-12 MED ORDER — FENTANYL CITRATE (PF) 100 MCG/2ML IJ SOLN
INTRAMUSCULAR | Status: AC
  Filled 2017-08-12: qty 2

## 2017-08-12 MED ORDER — LACTATED RINGERS IV BOLUS
500.0000 mL | Freq: Once | INTRAVENOUS | Status: AC
  Administered 2017-08-12: 500 mL via INTRAVENOUS

## 2017-08-12 MED ORDER — SIMETHICONE 80 MG PO CHEW: 80.0000 mg | CHEWABLE_TABLET | ORAL | Status: DC | PRN

## 2017-08-12 MED ORDER — LIDOCAINE HCL (PF) 1 % IJ SOLN
INTRAMUSCULAR | Status: AC
  Filled 2017-08-12: qty 30

## 2017-08-12 MED ORDER — FERROUS SULFATE 325 (65 FE) MG PO TABS
325.0000 mg | ORAL_TABLET | Freq: Two times a day (BID) | ORAL | Status: DC
  Administered 2017-08-13 – 2017-08-14 (×3): 325 mg via ORAL
  Filled 2017-08-12 (×3): qty 1

## 2017-08-12 MED ORDER — ONDANSETRON HCL 4 MG/2ML IJ SOLN: 4.0000 mg | Freq: Four times a day (QID) | INTRAMUSCULAR | Status: DC | PRN

## 2017-08-12 MED ORDER — NEOSTIGMINE METHYLSULFATE 10 MG/10ML IV SOLN
INTRAVENOUS | Status: DC | PRN
  Administered 2017-08-12: 4 mg via INTRAVENOUS

## 2017-08-12 MED ORDER — NEOSTIGMINE METHYLSULFATE 10 MG/10ML IV SOLN
INTRAVENOUS | Status: AC
  Filled 2017-08-12: qty 1

## 2017-08-12 MED ORDER — GLYCOPYRROLATE 0.2 MG/ML IJ SOLN
INTRAMUSCULAR | Status: DC | PRN
  Administered 2017-08-12: 0.4 mg via INTRAVENOUS

## 2017-08-12 MED ORDER — LIDOCAINE HCL (PF) 2 % IJ SOLN
INTRAMUSCULAR | Status: AC
  Filled 2017-08-12: qty 10

## 2017-08-12 MED ORDER — SIMETHICONE 80 MG PO CHEW
80.0000 mg | CHEWABLE_TABLET | ORAL | Status: DC
  Administered 2017-08-12 – 2017-08-13 (×2): 80 mg via ORAL
  Filled 2017-08-12 (×2): qty 1

## 2017-08-12 MED ORDER — OXYTOCIN BOLUS FROM INFUSION: 500.0000 mL | Freq: Once | INTRAVENOUS | Status: DC

## 2017-08-12 MED ORDER — TERBUTALINE SULFATE 1 MG/ML IJ SOLN: 0.2500 mg | Freq: Once | INTRAMUSCULAR | Status: DC | PRN

## 2017-08-12 MED ORDER — DEXAMETHASONE SODIUM PHOSPHATE 10 MG/ML IJ SOLN
INTRAMUSCULAR | Status: DC | PRN
  Administered 2017-08-12: 10 mg via INTRAVENOUS

## 2017-08-12 MED ORDER — KETOROLAC TROMETHAMINE 30 MG/ML IJ SOLN
INTRAMUSCULAR | Status: DC | PRN
  Administered 2017-08-12: 30 mg via INTRAVENOUS

## 2017-08-12 MED ORDER — LACTATED RINGERS IV SOLN: 500.0000 mL | INTRAVENOUS | Status: DC | PRN

## 2017-08-12 MED ORDER — KETOROLAC TROMETHAMINE 30 MG/ML IJ SOLN
INTRAMUSCULAR | Status: AC
  Filled 2017-08-12: qty 1

## 2017-08-12 MED ORDER — OXYTOCIN 10 UNIT/ML IJ SOLN
INTRAMUSCULAR | Status: AC
  Filled 2017-08-12: qty 2

## 2017-08-12 MED ORDER — BUPIVACAINE HCL (PF) 0.5 % IJ SOLN
INTRAMUSCULAR | Status: AC
  Filled 2017-08-12: qty 30

## 2017-08-12 MED ORDER — FENTANYL CITRATE (PF) 100 MCG/2ML IJ SOLN
INTRAMUSCULAR | Status: DC | PRN
  Administered 2017-08-12 (×2): 100 ug via INTRAVENOUS

## 2017-08-12 MED ORDER — KETOROLAC TROMETHAMINE 15 MG/ML IJ SOLN
15.0000 mg | Freq: Three times a day (TID) | INTRAMUSCULAR | Status: DC
  Filled 2017-08-12 (×2): qty 1

## 2017-08-12 MED ORDER — KETOROLAC TROMETHAMINE 30 MG/ML IJ SOLN
15.0000 mg | Freq: Three times a day (TID) | INTRAMUSCULAR | Status: AC
  Administered 2017-08-12 – 2017-08-13 (×2): 15 mg via INTRAVENOUS
  Filled 2017-08-12 (×2): qty 1

## 2017-08-12 MED ORDER — OXYTOCIN 40 UNITS IN LACTATED RINGERS INFUSION - SIMPLE MED
2.5000 [IU]/h | INTRAVENOUS | Status: AC
  Administered 2017-08-12: 2.5 [IU]/h via INTRAVENOUS
  Filled 2017-08-12 (×2): qty 1000

## 2017-08-12 MED ORDER — AMMONIA AROMATIC IN INHA
RESPIRATORY_TRACT | Status: AC
  Filled 2017-08-12: qty 10

## 2017-08-12 MED ORDER — LACTATED RINGERS IV SOLN: INTRAVENOUS | Status: DC

## 2017-08-12 MED ORDER — LIDOCAINE HCL (PF) 1 % IJ SOLN: 30.0000 mL | INTRAMUSCULAR | Status: DC | PRN

## 2017-08-12 MED ORDER — BISACODYL 10 MG RE SUPP: 10.0000 mg | Freq: Every day | RECTAL | Status: DC | PRN

## 2017-08-12 MED ORDER — HYDROMORPHONE HCL 1 MG/ML IJ SOLN
1.0000 mg | INTRAMUSCULAR | Status: DC | PRN
  Administered 2017-08-12: 1 mg via INTRAVENOUS
  Filled 2017-08-12: qty 2

## 2017-08-12 MED ORDER — OXYTOCIN 40 UNITS IN LACTATED RINGERS INFUSION - SIMPLE MED: 2.5000 [IU]/h | INTRAVENOUS | Status: DC

## 2017-08-12 MED ORDER — BUPIVACAINE HCL (PF) 0.5 % IJ SOLN
INTRAMUSCULAR | Status: DC | PRN
  Administered 2017-08-12: 10 mL

## 2017-08-12 MED ORDER — PROPOFOL 10 MG/ML IV BOLUS
INTRAVENOUS | Status: AC
  Filled 2017-08-12: qty 20

## 2017-08-12 MED ORDER — BUPIVACAINE 0.25 % ON-Q PUMP DUAL CATH 400 ML
400.0000 mL | INJECTION | Status: DC
  Filled 2017-08-12: qty 400

## 2017-08-12 MED ORDER — OXYCODONE-ACETAMINOPHEN 5-325 MG PO TABS
2.0000 | ORAL_TABLET | ORAL | Status: DC | PRN
  Administered 2017-08-13 – 2017-08-14 (×5): 2 via ORAL
  Filled 2017-08-12 (×5): qty 2

## 2017-08-12 MED ORDER — ONDANSETRON HCL 4 MG/2ML IJ SOLN
INTRAMUSCULAR | Status: AC
  Filled 2017-08-12: qty 2

## 2017-08-12 MED ORDER — ROCURONIUM BROMIDE 100 MG/10ML IV SOLN
INTRAVENOUS | Status: DC | PRN
  Administered 2017-08-12: 25 mg via INTRAVENOUS

## 2017-08-12 MED ORDER — DIPHENHYDRAMINE HCL 25 MG PO CAPS: 25.0000 mg | ORAL_CAPSULE | Freq: Four times a day (QID) | ORAL | Status: DC | PRN

## 2017-08-12 MED ORDER — PRENATAL MULTIVITAMIN CH
1.0000 | ORAL_TABLET | Freq: Every day | ORAL | Status: DC
  Administered 2017-08-13 – 2017-08-14 (×2): 1 via ORAL
  Filled 2017-08-12 (×2): qty 1

## 2017-08-12 MED ORDER — WITCH HAZEL-GLYCERIN EX PADS: 1.0000 "application " | MEDICATED_PAD | CUTANEOUS | Status: DC | PRN

## 2017-08-12 MED ORDER — PRENATAL MULTIVITAMIN CH: 1.0000 | ORAL_TABLET | Freq: Every day | ORAL | Status: DC

## 2017-08-12 MED ORDER — SUCCINYLCHOLINE CHLORIDE 20 MG/ML IJ SOLN
INTRAMUSCULAR | Status: DC | PRN
  Administered 2017-08-12: 100 mg via INTRAVENOUS

## 2017-08-12 MED ORDER — DIBUCAINE 1 % RE OINT: 1.0000 "application " | TOPICAL_OINTMENT | RECTAL | Status: DC | PRN

## 2017-08-12 SURGICAL SUPPLY — 23 items
CANISTER SUCT 3000ML PPV (MISCELLANEOUS) ×3 IMPLANT
CHLORAPREP W/TINT 26ML (MISCELLANEOUS) ×6 IMPLANT
DERMABOND ADVANCED (GAUZE/BANDAGES/DRESSINGS) ×2
DERMABOND ADVANCED .7 DNX12 (GAUZE/BANDAGES/DRESSINGS) ×1 IMPLANT
DRSG OPSITE POSTOP 4X10 (GAUZE/BANDAGES/DRESSINGS) ×3 IMPLANT
ELECT CAUTERY BLADE 6.4 (BLADE) ×3 IMPLANT
ELECT REM PT RETURN 9FT ADLT (ELECTROSURGICAL) ×3
ELECTRODE REM PT RTRN 9FT ADLT (ELECTROSURGICAL) ×1 IMPLANT
GLOVE BIOGEL PI IND STRL 6.5 (GLOVE) ×1 IMPLANT
GLOVE BIOGEL PI INDICATOR 6.5 (GLOVE) ×2
GOWN STRL REUS W/ TWL LRG LVL3 (GOWN DISPOSABLE) ×1 IMPLANT
GOWN STRL REUS W/ TWL XL LVL3 (GOWN DISPOSABLE) ×2 IMPLANT
GOWN STRL REUS W/TWL LRG LVL3 (GOWN DISPOSABLE) ×2
GOWN STRL REUS W/TWL XL LVL3 (GOWN DISPOSABLE) ×4
NS IRRIG 1000ML POUR BTL (IV SOLUTION) ×3 IMPLANT
PACK C SECTION AR (MISCELLANEOUS) ×3 IMPLANT
PAD OB MATERNITY 4.3X12.25 (PERSONAL CARE ITEMS) ×3 IMPLANT
PAD PREP 24X41 OB/GYN DISP (PERSONAL CARE ITEMS) ×3 IMPLANT
SUT MNCRL AB 4-0 PS2 18 (SUTURE) ×3 IMPLANT
SUT PLAIN 3-0 (SUTURE) ×3 IMPLANT
SUT VIC AB 0 CT1 36 (SUTURE) ×9 IMPLANT
SUT VIC AB 2-0 CT1 36 (SUTURE) ×3 IMPLANT
SYR 30ML LL (SYRINGE) ×6 IMPLANT

## 2017-08-12 NOTE — Anesthesia Preprocedure Evaluation (Signed)
Anesthesia Evaluation  Patient identified by MRN, date of birth, ID band Patient awake    Reviewed: Allergy & Precautions, H&P , NPO status , Patient's Chart, lab work & pertinent test results, reviewed documented beta blocker date and time   History of Anesthesia Complications Negative for: history of anesthetic complications  Airway Mallampati: II  TM Distance: >3 FB Neck ROM: full    Dental  (+) Dental Advidsory Given   Pulmonary neg shortness of breath, asthma (as a child) , neg recent URI,           Cardiovascular Exercise Tolerance: Good negative cardio ROS       Neuro/Psych negative neurological ROS  negative psych ROS   GI/Hepatic negative GI ROS, Neg liver ROS,   Endo/Other  negative endocrine ROS  Renal/GU negative Renal ROS  negative genitourinary   Musculoskeletal   Abdominal   Peds  Hematology negative hematology ROS (+)   Anesthesia Other Findings Past Medical History: No date: Medical history non-contributory  History of cocaine use during pregnancy (last positive was on 6/13), but none recently and with a negative UDS today.  Patient admits to drug use within the last week.  Given emergent nature of the case, we will proceed with an RSI.  Patient last ate recently.  Reproductive/Obstetrics (+) Pregnancy                             Anesthesia Physical Anesthesia Plan  ASA: II and emergent  Anesthesia Plan: General   Post-op Pain Management:    Induction: Intravenous, Rapid sequence and Cricoid pressure planned  PONV Risk Score and Plan: 3 and Ondansetron and Dexamethasone  Airway Management Planned: Oral ETT  Additional Equipment:   Intra-op Plan:   Post-operative Plan: Extubation in OR  Informed Consent: I have reviewed the patients History and Physical, chart, labs and discussed the procedure including the risks, benefits and alternatives for the proposed  anesthesia with the patient or authorized representative who has indicated his/her understanding and acceptance.   Dental Advisory Given  Plan Discussed with: Anesthesiologist, CRNA and Surgeon  Anesthesia Plan Comments:         Anesthesia Quick Evaluation

## 2017-08-12 NOTE — Progress Notes (Signed)
SVE 4/50/-3 NST: 150  bpm baseline, moderate variability, none accelerations, decelerations present, can not associate with contractions. Attempting position changes and 500 cc bolus to see if that helps with fetal heart tones. No blood seen on examination.   Tocometer : irregular  Adelene Idlerhristanna Shawniece Oyola MD [facility], [facility] 08/12/17 1:38 PM

## 2017-08-12 NOTE — Progress Notes (Signed)
15 minute call to floor. 

## 2017-08-12 NOTE — H&P (Signed)
History and Physical  Carolyn Petersen is an 27 y.o. female.   HPI:  Patient presented this morning complaining of vaginal bleeding after intercourse. She was evaluated by nursing and found to be 3-4 cm with a small amount of dark red blood on glove. No passage of large clots or heavy active vaginal bleeding.  Initially she had been contracting about every 5 minutes but those contractions have now resolved.   Past Medical History:  Diagnosis Date  . Medical history non-contributory     Past Surgical History:  Procedure Laterality Date  . NO PAST SURGERIES      History reviewed. No pertinent family history.  Social History:  reports that she has never smoked. She has never used smokeless tobacco. She reports that she has current or past drug history. Drugs: Marijuana and Cocaine. She reports that she does not drink alcohol.  Allergies: No Known Allergies  Medications: I have reviewed the patient's current medications.  Results for orders placed or performed during the hospital encounter of 08/12/17 (from the past 48 hour(s))  Urine Drug Screen, Qualitative ([facility] only)     Status: None (Preliminary result)   Collection Time: 08/12/17  8:38 AM  Result Value Ref Range   Tricyclic, Ur Screen NONE DETECTED NONE DETECTED   Amphetamines, Ur Screen NONE DETECTED NONE DETECTED   MDMA (Ecstasy)Ur Screen NONE DETECTED NONE DETECTED   Cocaine Metabolite,Ur [place] NONE DETECTED NONE DETECTED   Opiate, Ur Screen NONE DETECTED NONE DETECTED   Phencyclidine (PCP) Ur S NONE DETECTED NONE DETECTED   Cannabinoid 50 Ng, Ur [place] NONE DETECTED NONE DETECTED   Barbiturates, Ur Screen PENDING NONE DETECTED   Benzodiazepine, Ur Scrn NONE DETECTED NONE DETECTED   Methadone Scn, Ur NONE DETECTED NONE DETECTED    Comment: (NOTE) Tricyclics + metabolites, urine    Cutoff 1000 ng/mL Amphetamines + metabolites, urine  Cutoff 1000 ng/mL MDMA (Ecstasy), urine              Cutoff 500 ng/mL Cocaine Metabolite,  urine          Cutoff 300 ng/mL Opiate + metabolites, urine        Cutoff 300 ng/mL Phencyclidine (PCP), urine         Cutoff 25 ng/mL Cannabinoid, urine                 Cutoff 50 ng/mL Barbiturates + metabolites, urine  Cutoff 200 ng/mL Benzodiazepine, urine              Cutoff 200 ng/mL Methadone, urine                   Cutoff 300 ng/mL The urine drug screen provides only a preliminary, unconfirmed analytical test result and should not be used for non-medical purposes. Clinical consideration and professional judgment should be applied to any positive drug screen result due to possible interfering substances. A more specific alternate chemical method must be used in order to obtain a confirmed analytical result. Gas chromatography / mass spectrometry (GC/MS) is the preferred confirmat ory method. Performed at [facility], [address] [place], [place] [address]   CBC     Status: None   Collection Time: 08/12/17 10:33 AM  Result Value Ref Range   WBC 6.2 3.6 - 11.0 K/uL   RBC 3.99 3.80 - 5.20 MIL/uL   Hemoglobin 12.4 12.0 - 16.0 g/dL   HCT 60.4 54.0 - 98.1 %   MCV 89.7 80.0 - 100.0 fL  MCH 31.1 26.0 - 34.0 pg   MCHC 34.7 32.0 - 36.0 g/dL   RDW 40.913.7 81.111.5 - 91.414.5 %   Platelets 196 150 - 440 K/uL    Comment: Performed at [facility], [address] [place], [place] [address]    No results found.  Review of Systems  Constitutional: Negative for chills, fever, malaise/fatigue and weight loss.  HENT: Negative for congestion, hearing loss and sinus pain.   Eyes: Negative for blurred vision and double vision.  Respiratory: Negative for cough, sputum production, shortness of breath and wheezing.   Cardiovascular: Negative for chest pain, palpitations, orthopnea and leg swelling.  Gastrointestinal: Negative for abdominal pain, constipation, diarrhea, nausea and vomiting.  Genitourinary: Negative for dysuria, flank pain, frequency, hematuria and urgency.   Musculoskeletal: Negative for back pain, falls and joint pain.  Skin: Negative for itching and rash.  Neurological: Negative for dizziness and headaches.  Psychiatric/Behavioral: Negative for depression, substance abuse and suicidal ideas. The patient is not nervous/anxious.    Blood pressure 139/76, pulse 94, temperature 97.9 F (36.6 C), resp. rate 16, height 5\' 2"  (1.575 m), weight 149 lb (67.6 kg), last menstrual period 11/12/2016, unknown if currently breastfeeding. Physical Exam  Nursing note and vitals reviewed. Constitutional: She is oriented to person, place, and time. She appears well-developed and well-nourished.  HENT:  Head: Normocephalic and atraumatic.  Cardiovascular: Normal rate and regular rhythm.  Respiratory: Effort normal and breath sounds normal.  GI: Soft. Bowel sounds are normal.  Musculoskeletal: Normal range of motion.  Neurological: She is alert and oriented to person, place, and time.  Skin: Skin is warm and dry.  Psychiatric: She has a normal mood and affect. Her behavior is normal. Judgment and thought content normal.    Assessment/Plan: 27 yo [id number] 1. Vaginal bleeding- improved likely secondary to recent intercourse, but patient has been positive for cocaine this pregnancy, given that she is 39 weeks and favorable will keep her and induce with pitocin.  2. GBS negative. 3. Okay for an epidural when desired.   Christanna R Schuman 08/12/2017, 10:48 AM

## 2017-08-12 NOTE — OB Triage Note (Signed)
Pt co ctx since 2230 last night. Denies LOF. Reports good fetal movement. Bleeding noted this am when she wiped after using the bathroom. Elaina HoopsElks, Pallas Wahlert S

## 2017-08-12 NOTE — Progress Notes (Signed)
Patient ID: Carolyn Petersen, female   DOB: Feb 13, 1991, 27 y.o.   MRN: [id number]  Patient in more severe abdominal pain, has started having bright red vaginal bleeding, small to moderate in amount SVE 5/80/-3 bulging membranes.  Pain is coming and going is not constant.   Will proceed with STAT cesarean section for suspected abruption NST: 150 bpm baseline, minimal variability, none accelerations, variable decelerations. Tocometer: not graphing well about every 3-5 minutes at bedside  Adelene Idlerhristanna Schuman MD [facility], [facility] 08/12/17 2:43 PM

## 2017-08-12 NOTE — Clinical Social Work Note (Signed)
CSW received consult for drug exposed newborn. CSW will assess when able and appropriate.  Carolyn PonderKaren Martha Adriel Desrosier, MSW, Theresia MajorsLCSWA [phone]

## 2017-08-12 NOTE — Discharge Summary (Signed)
OB Discharge Summary     Patient Name: Carolyn Petersen DOB: 1991-01-07 MRN: [id number]  Date of admission: 08/12/2017 Delivering MD: Farrel Conners, CNM  Date of Delivery: 08/12/2017  Date of discharge: 08/14/2017  Admitting diagnosis: Spotting contractions Intrauterine pregnancy: [redacted]w[redacted]d     Secondary diagnosis:  Suspected placental abruption, fetal intolerance of labor, cesaean delivery     Discharge diagnosis: Term Pregnancy Delivered, Reasons for cesarean section  Non-reassuring FHR and suspected placental abruption                         Hospital course:  Induction of Labor With Cesarean Section  27 y.o. yo G5P5005 at [redacted]w[redacted]d was admitted to the hospital 08/12/2017 for induction of labor. Patient had a labor course significant for fetal intolerance of labor. The patient went for cesarean section due to suspected placental abruption with active vaginal bleeding and fetal intolerance of labor, infant was OP. , and delivered a Viable infant,08/12/2017  Membrane Rupture Time/Date: 2:59 PM ,08/12/2017   Details of operation can be found in separate operative Note.  Patient had an uncomplicated postpartum course. She is ambulating, tolerating a regular diet, passing flatus, and urinating well.  Patient is discharged home in stable condition on 08/14/17.                                                                                                   Post partum procedures:none  Complications: Placental Abruption, fetal intolerance to labor  Physical exam on 08/14/2017: Vitals:   08/13/17 0732 08/13/17 1151 08/13/17 2327 08/14/17 0803  BP: 110/64 114/72 110/70 111/60  Pulse: (!) 54 62 62 60  Resp: 18 18 18 20   Temp: 97.6 F (36.4 C) 97.8 F (36.6 C) 98.2 F (36.8 C) 97.6 F (36.4 C)  TempSrc: Oral Oral Oral Oral  SpO2: 99%  98% 99%  Weight:      Height:       General: alert, cooperative and no distress Passing flatus, tolerating a regular diet, voiding without difficulty. Percocet  helping pain Heart: RRR without murmur Lungs: CTAB Lochia: appropriate Abdomen: Bowel sounds active x 4, soft, non distended Incision: Dressing is clean, dry, and intact; ON Q is intact DVT Evaluation: No evidence of DVT seen on physical exam.  Labs: Lab Results  Component Value Date   WBC 14.1 (H) 08/13/2017   HGB 10.1 (L) 08/13/2017   HCT 29.4 (L) 08/13/2017   MCV 90.7 08/13/2017   PLT 176 08/13/2017   CMP Latest Ref Rng & Units 07/31/2017  Glucose 65 - 99 mg/dL 79  BUN 6 - 20 mg/dL 6  Creatinine 3.08 - 6.57 mg/dL 8.46  Sodium 962 - 952 mmol/L 134(L)  Potassium 3.5 - 5.1 mmol/L 3.7  Chloride 101 - 111 mmol/L 106  CO2 22 - 32 mmol/L 21(L)  Calcium 8.9 - 10.3 mg/dL 8.2(L)  Total Protein 6.5 - 8.1 g/dL 6.8  Total Bilirubin 0.3 - 1.2 mg/dL 0.5  Alkaline Phos 38 - 126 U/L 89  AST 15 - 41 U/L 20  ALT 14 - 54 U/L  10(L)    Discharge instruction: per After Visit Summary.  Medications:  Allergies as of 08/14/2017   No Known Allergies     Medication List    STOP taking these medications   ENSURE HEALTHY MOM Liqd     TAKE these medications   docusate sodium 100 MG capsule Commonly known as:  COLACE Take 1 capsule (100 mg total) by mouth 2 (two) times daily as needed for mild constipation or moderate constipation.   ferrous sulfate 325 (65 FE) MG tablet Take 1 tablet (325 mg total) by mouth daily with breakfast.   ibuprofen 600 MG tablet Commonly known as:  ADVIL,MOTRIN Take 1 tablet (600 mg total) by mouth every 6 (six) hours as needed.   oxyCODONE-acetaminophen 5-325 MG tablet Commonly known as:  PERCOCET/ROXICET Take 1-2 tablets by mouth every 6 (six) hours as needed for up to 7 days for moderate pain or severe pain.   prenatal multivitamin Tabs tablet Take 1 tablet by mouth daily at 12 noon.       Diet: routine diet  Activity: Advance as tolerated. Pelvic rest for 6 weeks.   Outpatient follow up: Follow-up Information    Schuman, Jaquelyn Bitterhristanna R, MD.  Schedule an appointment as soon as possible for a visit in 1 week.   Specialty:  Obstetrics and Gynecology Contact information: [address] [place] [address] [phone]             Postpartum contraception: Depo Provera bridge, Nexplanon Rhogam Given postpartum: no Rubella vaccine given postpartum: no Varicella vaccine given postpartum: no TDaP given antepartum or postpartum: No, last dose 2017  Newborn Data: Live born female  Birth Weight: 5 lb 8.5 oz (2510 g) APGAR: 8, 9  Newborn Delivery   Birth date/time:  08/12/2017 14:59:00 Delivery type:  C-Section, Low Transverse C-section categorization:  Primary      Baby Feeding: Bottle  Disposition:home with mother  SIGNED:  Farrel ConnersColleen Kamsiyochukwu Spickler, CNM 08/14/2017 10:31 AM

## 2017-08-12 NOTE — Transfer of Care (Signed)
Immediate Anesthesia Transfer of Care Note  Patient: Carolyn Petersen  Procedure(s) Performed: CESAREAN SECTION (N/A Abdomen)  Patient Location: PACU  Anesthesia Type:General  Level of Consciousness: awake, oriented, drowsy and patient cooperative  Airway & Oxygen Therapy: Patient Spontanous Breathing  Post-op Assessment: Report given to RN, Post -op Vital signs reviewed and stable and Patient moving all extremities X 4  Post vital signs: Reviewed and stable  Last Vitals:  Vitals Value Taken Time  BP 114/72 08/12/2017  4:20 PM  Temp 36.7 C 08/12/2017  4:20 PM  Pulse 93 08/12/2017  4:23 PM  Resp 22 08/12/2017  4:23 PM  SpO2 100 % 08/12/2017  4:23 PM  Vitals shown include unvalidated device data.  Last Pain:  Vitals:   08/12/17 0734  PainSc: 5          Complications: No apparent anesthesia complications

## 2017-08-12 NOTE — OR Nursing (Signed)
Xray taken of abdomen to verify that no foreign bodies were left behind due to the emergent nature of the case, no initial count was done. Md aware and in room and verified final xray and verified no foreign bodies found.

## 2017-08-12 NOTE — Anesthesia Post-op Follow-up Note (Signed)
Anesthesia QCDR form completed.        

## 2017-08-12 NOTE — Anesthesia Postprocedure Evaluation (Signed)
Anesthesia Post Note  Patient: Martie RoundJalesia Vi  Procedure(s) Performed: CESAREAN SECTION (N/A Abdomen)  Patient location during evaluation: PACU Anesthesia Type: General Level of consciousness: awake and alert Pain management: pain level controlled Vital Signs Assessment: post-procedure vital signs reviewed and stable Respiratory status: spontaneous breathing, nonlabored ventilation, respiratory function stable and patient connected to nasal cannula oxygen Cardiovascular status: blood pressure returned to baseline and stable Postop Assessment: no apparent nausea or vomiting Anesthetic complications: no     Last Vitals:  Vitals:   08/12/17 1846 08/12/17 1848  BP: 120/75 118/77  Pulse:    Resp:    Temp:    SpO2:      Last Pain:  Vitals:   08/12/17 1855  PainSc: 3                  Lenard SimmerAndrew Fabrizio Filip

## 2017-08-12 NOTE — Op Note (Signed)
Cesarean Section Procedure Note 08/12/17  Pre-operative Diagnosis: 1. Suspected placental abruption 2. Fetal intolerance of labor, remote from delivery Post-operative Diagnosis: same, delivered. Procedure:  Low Transverse Cesarean Section Surgeon: Adelene Idlerhristanna Tanysha Quant MD   Anesthesia: General Estimated Blood Loss: 700 Complications: None; patient tolerated the procedure well.   Disposition: PACU - hemodynamically stable. Condition: stable   Findings: A female infant in the cephalic presentation. Amniotic fluid - clear   Birth weight: 5 lbs 9oz Apgars of 8 and 9.  Intact placenta with a three-vessel cord. Grossly normal uterus, tubes and ovaries bilaterally. No intraabdominal adhesions were noted.   Procedure Details    The patient was taken to operating room, identified as the correct patient and the procedure verified as C-Section Delivery. A time out was held and the above information confirmed. After induction of anesthesia, the patient was draped and prepped in the usual sterile manner. A Pfannenstiel incision was made and carried down through the subcutaneous tissue to the fascia. Fascial incision was made and extended transversely with the Mayo scissors. The fascia was separated from the underlying rectus tissue superiorly and inferiorly. The peritoneum was identified and entered bluntly. Peritoneal incision was extended longitudinally. A low transverse hysterotomy was made. The fetus was delivered atraumatically. The umbilical cord was clamped x2 and cut and the infant was handed to the awaiting pediatricians. The placenta was removed intact and appeared normal with a 3-vessel cord. There were some small dark clots that were present behind the placenta.  The uterus was exteriorized and cleared of all clot and debris.The hysterotomy was closed with running sutures of 0 Vicryl suture. Two figure of eight suture were placed in the midline. Excellent hemostasis was observed. The uterus was  returned to the abdomen. The pelvis was irrigated and again, excellent hemostasis was noted. The peritoneum was closed with a running stitch of 2-0 Vicryl. The On Q Pain pump System was then placed.  Trocars were placed through the abdominal wall into the subfascial space and these were used to thread the silver soaker cathaters into place.The rectus muscles were inspected and were hemostatic. The rectus fascia was then reapproximated with running sutures of 0-vicryl, with careful placement not to incorporate the cathaters. Subcutaneous tissues are then irrigated with saline and hemostasis assured with the bovie. The subcutaneous fat was approximated with 3-0 plain and a running stitch.  Skin wass then closed with 4-0 monocryl suture in a subcuticular fashion followed by skin adhesive. The cathaters are flushed each with 5 mL of Bupivicaine and stabilized into place with dressing. Instrument, sponge, and needle counts were correct prior to the abdominal closure and at the conclusion of the case.  The patient tolerated the procedure well and was transferred to the recovery room in stable condition.   Natale Milchhristanna R Aadil Sur MD [facility], [facility] 08/12/17 4:12 PM

## 2017-08-12 NOTE — Anesthesia Procedure Notes (Signed)
Procedure Name: Intubation Performed by: Milus Height, CRNA Pre-anesthesia Checklist: Patient identified, Patient being monitored, Timeout performed, Emergency Drugs available and Suction available Patient Re-evaluated:Patient Re-evaluated prior to induction Oxygen Delivery Method: Circle system utilized Preoxygenation: Pre-oxygenation with 100% oxygen Induction Type: IV induction, Cricoid Pressure applied and Rapid sequence Ventilation: Unable to mask ventilate Laryngoscope Size: Mac and 3 Grade View: Grade I Tube type: Oral Tube size: 7.0 mm Number of attempts: 1 Airway Equipment and Method: Stylet Placement Confirmation: ETT inserted through vocal cords under direct vision,  positive ETCO2 and breath sounds checked- equal and bilateral Secured at: 21 cm Tube secured with: Tape Dental Injury: Teeth and Oropharynx as per pre-operative assessment

## 2017-08-12 NOTE — OR Nursing (Signed)
md looked at xray via epic and said it was ok to wake patient up.

## 2017-08-13 LAB — CBC
HEMATOCRIT: 29.4 % — AB (ref 35.0–47.0)
Hemoglobin: 10.1 g/dL — ABNORMAL LOW (ref 12.0–16.0)
MCH: 31 pg (ref 26.0–34.0)
MCHC: 34.2 g/dL (ref 32.0–36.0)
MCV: 90.7 fL (ref 80.0–100.0)
Platelets: 176 10*3/uL (ref 150–440)
RBC: 3.25 MIL/uL — ABNORMAL LOW (ref 3.80–5.20)
RDW: 13.7 % (ref 11.5–14.5)
WBC: 14.1 10*3/uL — AB (ref 3.6–11.0)

## 2017-08-13 LAB — RPR: RPR Ser Ql: NONREACTIVE

## 2017-08-13 NOTE — Progress Notes (Signed)
Provided period of purple cry video for mother/father to watch. Both refuse at this time as they report seeing the video multiple times in the past as they have 4 other young children who are present in the room. Provided a copy of the video for them to take home, and offered assistance with any questions or concerns.

## 2017-08-13 NOTE — Clinical Social Work Maternal (Signed)
  CLINICAL SOCIAL WORK MATERNAL/CHILD NOTE  Patient Details  Name: Carolyn Petersen MRN: [id number] Date of Birth: May 04, 1990  Date:  08/13/2017  Clinical Social Worker Initiating Note:  York SpanielMonica Tyliah Schlereth MSW,LCSW  Date/Time: Initiated:  08/13/17/      Child's Name:      Biological Parents:  Mother, Father   Need for Interpreter:  None   Reason for Referral:  Current Substance Use/Substance Use During Pregnancy    Address:  [address] [place] [address]    Phone number:  [phone] (home)     Additional phone number: none  Household Members/Support Persons (HM/SP):       HM/SP Name Relationship DOB or Age  HM/SP -1        HM/SP -2        HM/SP -3        HM/SP -4        HM/SP -5        HM/SP -6        HM/SP -7        HM/SP -8          Natural Supports (not living in the home):      Professional Supports:     Employment: Unemployed   Type of Work:     Education:      Homebound arranged:    Surveyor, quantityinancial Resources:  Medicaid   Other Resources:  Child Support, Sales executiveood Stamps    Cultural/Religious Considerations Which May Impact Care:  none  Strengths:  Ability to meet basic needs    Psychotropic Medications:         Pediatrician:       [place]:   [place]    [place]    [place]    [place]    [place]      Pediatrician Fax Number:    Risk Factors/Current Problems:  Substance Use    Cognitive State:  Alert , Able to Concentrate    Mood/Affect:  Calm (worried)   CSW Assessment: CSW spoke with patient this morning regarding consult for cocaine/marijuana use during her pregnancy. CSW explained role and purpose of visit. Patient was pleasant and cooperative with assessment. Patient was actively caring for her newborn when CSW walked into the room. Patient tells CSW that she has four other children in the home and that this is her fifth. She states that the father of baby for all the  children is very supportive and financially supports all of them. Patient denies any history of mental illness. Patient admits to marijuana and cocaine use during pregnancy but states that the marijuana use was during the beginning of her pregnancy to assist with appetite. She states the cocaine use was one time and it was in the earlier stages of the pregnancy (although she had a positive urine drug screen last month for cocaine). CSW explained to patient that if the cord tissue returns positive, that a DSS Child Protective Services report would be made. CSW explained what that process would look like. Patient reported that she never used in front of her children and that her children were in the care of her parents if she went to a party. Patient denies any need for rehab resources at this time.   CSW Plan/Description:  CSW Will Continue to Monitor Umbilical Cord Tissue Drug Screen Results and Make Report if [facility]    Rodneisha Bonnet, LCSW 08/13/2017, 12:25 PM

## 2017-08-13 NOTE — Progress Notes (Signed)
POD#1 pLTCS Subjective:  Resting in bed, pain control is adequate with PRN medication. Tolerating a regular diet without nausea. Has been up but not ambulated. Urine output good.  Objective:  Blood pressure 110/64, pulse (!) 54, temperature 97.6 F (36.4 C), temperature source Oral, resp. rate 18, height 5\' 2"  (1.575 m), weight 149 lb (67.6 kg), last menstrual period 11/12/2016, SpO2 99 %.  General: NAD CV: RRR Pulmonary: no increased work of breathing, CTAB Abdomen: non-distended, non-tender, fundus firm at level of umbilicus, +bowel sounds Incision: Dressing C/D/I, On-Q in place Extremities: no edema, no erythema, no tenderness  Results for orders placed or performed during the hospital encounter of 08/12/17 (from the past 72 hour(s))  Urine Drug Screen, Qualitative ([facility] only)     Status: Abnormal   Collection Time: 08/12/17  8:38 AM  Result Value Ref Range   Tricyclic, Ur Screen NONE DETECTED NONE DETECTED   Amphetamines, Ur Screen NONE DETECTED NONE DETECTED   MDMA (Ecstasy)Ur Screen NONE DETECTED NONE DETECTED   Cocaine Metabolite,Ur [place] NONE DETECTED NONE DETECTED   Opiate, Ur Screen NONE DETECTED NONE DETECTED   Phencyclidine (PCP) Ur S NONE DETECTED NONE DETECTED   Cannabinoid 50 Ng, Ur [place] NONE DETECTED NONE DETECTED   Barbiturates, Ur Screen (A) NONE DETECTED    Result not available. Reagent lot number recalled by manufacturer.   Benzodiazepine, Ur Scrn NONE DETECTED NONE DETECTED   Methadone Scn, Ur NONE DETECTED NONE DETECTED    Comment: (NOTE) Tricyclics + metabolites, urine    Cutoff 1000 ng/mL Amphetamines + metabolites, urine  Cutoff 1000 ng/mL MDMA (Ecstasy), urine              Cutoff 500 ng/mL Cocaine Metabolite, urine          Cutoff 300 ng/mL Opiate + metabolites, urine        Cutoff 300 ng/mL Phencyclidine (PCP), urine         Cutoff 25 ng/mL Cannabinoid, urine                 Cutoff 50 ng/mL Barbiturates + metabolites, urine  Cutoff 200  ng/mL Benzodiazepine, urine              Cutoff 200 ng/mL Methadone, urine                   Cutoff 300 ng/mL The urine drug screen provides only a preliminary, unconfirmed analytical test result and should not be used for non-medical purposes. Clinical consideration and professional judgment should be applied to any positive drug screen result due to possible interfering substances. A more specific alternate chemical method must be used in order to obtain a confirmed analytical result. Gas chromatography / mass spectrometry (GC/MS) is the preferred confirmat ory method. Performed at [facility], [address] [place], [place] [address]   CBC     Status: None   Collection Time: 08/12/17 10:33 AM  Result Value Ref Range   WBC 6.2 3.6 - 11.0 K/uL   RBC 3.99 3.80 - 5.20 MIL/uL   Hemoglobin 12.4 12.0 - 16.0 g/dL   HCT 08.6 57.8 - 46.9 %   MCV 89.7 80.0 - 100.0 fL   MCH 31.1 26.0 - 34.0 pg   MCHC 34.7 32.0 - 36.0 g/dL   RDW 62.9 52.8 - 41.3 %   Platelets 196 150 - 440 K/uL    Comment: Performed at [facility], [address] [place], [place] [address]  RPR  Status: None   Collection Time: 08/12/17 10:33 AM  Result Value Ref Range   RPR Ser Ql Non Reactive Non Reactive    Comment: (NOTE) Performed At: [facility] [address] [place], [place] [id number] Jolene SchimkeNagendra Sanjai MD FA:[id number]:[phone] Performed at [facility], [address] [place], [place] [address]   Rapid HIV screen (HIV 1/2 Ab+Ag) ([facility] Only)     Status: None   Collection Time: 08/12/17 10:33 AM  Result Value Ref Range   HIV-1 P24 Antigen - HIV24 NON REACTIVE NON REACTIVE   HIV 1/2 Antibodies NON REACTIVE NON REACTIVE   Interpretation (HIV Ag Ab)      A non reactive test result means that HIV 1 or HIV 2 antibodies and HIV 1 p24 antigen were not detected in the specimen.    Comment: Performed at [facility], [address] [place], [place] [address]  Type and  screen     Status: None   Collection Time: 08/12/17 11:19 AM  Result Value Ref Range   ABO/RH(D) O POS    Antibody Screen NEG    Sample Expiration      08/15/2017 Performed at [facility], [address] [place], [place] [address]   CBC     Status: Abnormal   Collection Time: 08/13/17  5:17 AM  Result Value Ref Range   WBC 14.1 (H) 3.6 - 11.0 K/uL   RBC 3.25 (L) 3.80 - 5.20 MIL/uL   Hemoglobin 10.1 (L) 12.0 - 16.0 g/dL   HCT 10.229.4 (L) 72.535.0 - 36.647.0 %   MCV 90.7 80.0 - 100.0 fL   MCH 31.0 26.0 - 34.0 pg   MCHC 34.2 32.0 - 36.0 g/dL   RDW 44.013.7 34.711.5 - 42.514.5 %   Platelets 176 150 - 440 K/uL    Comment: Performed at [facility], [address] [place], [place] [address]     Assessment:   27 y.o. [id number] post-operative day #1 in stable condition.  Plan:  1) Acute blood loss anemia - hemodynamically stable and asymptomatic - PO ferrous sulfate  2) Blood Type --/--/O POS (06/30 1119) / Rubella 3.19 (01/04 1417) / Varicella immune  3) TDAP status: unknown  4) Formula feeding  5) Contraception: DepoProvera, then Nexplanon  6) Disposition: continue postpartum care.  Marcelyn BruinsJacelyn Kyen Taite, CNM 08/13/2017  9:30 AM

## 2017-08-14 MED ORDER — DOCUSATE SODIUM 100 MG PO CAPS
100.0000 mg | ORAL_CAPSULE | Freq: Two times a day (BID) | ORAL | 1 refills | Status: AC | PRN
Start: 2017-08-14 — End: 2018-08-14

## 2017-08-14 MED ORDER — OXYCODONE-ACETAMINOPHEN 5-325 MG PO TABS: 1.0000 | ORAL_TABLET | Freq: Four times a day (QID) | ORAL | 0 refills | Status: AC | PRN

## 2017-08-14 MED ORDER — FERROUS SULFATE 325 (65 FE) MG PO TABS: 325.0000 mg | ORAL_TABLET | Freq: Every day | ORAL | 0 refills | Status: DC

## 2017-08-14 MED ORDER — MEDROXYPROGESTERONE ACETATE 150 MG/ML IM SUSP
150.0000 mg | Freq: Once | INTRAMUSCULAR | Status: AC
  Administered 2017-08-14: 150 mg via INTRAMUSCULAR
  Filled 2017-08-14: qty 1

## 2017-08-14 MED ORDER — IBUPROFEN 600 MG PO TABS: 600.0000 mg | ORAL_TABLET | Freq: Four times a day (QID) | ORAL | 1 refills | Status: DC | PRN

## 2017-08-14 NOTE — Discharge Instructions (Signed)

## 2017-08-14 NOTE — Progress Notes (Signed)
Provided and reviewed discharge paperwork and prescriptions. Patient verbalized understanding of instructions provided. Follow up appointment for July 10 provided. OnQ pump intact with clean/dry/intact honeycomb dressing to lower abdomen. Mother discharged with infant to go home via wheelchair, taken to visitor entrance by hospital volunteer.

## 2017-08-15 LAB — SURGICAL PATHOLOGY

## 2017-08-17 ENCOUNTER — Encounter: Payer: Medicaid Other | Admitting: Advanced Practice Midwife

## 2017-08-17 ENCOUNTER — Telehealth: Payer: Self-pay

## 2017-08-17 NOTE — Telephone Encounter (Signed)
Pt calling had questions about her positive drug test. She states she has never used any cocaine, pt states a Child psychotherapistsocial worker has contacted her about this. she's aware they can call us uf they have any questions,

## 2017-08-22 ENCOUNTER — Ambulatory Visit: Payer: Medicaid Other | Admitting: Obstetrics and Gynecology

## 2017-08-27 ENCOUNTER — Encounter: Payer: Self-pay | Admitting: Obstetrics and Gynecology

## 2017-08-27 ENCOUNTER — Ambulatory Visit (INDEPENDENT_AMBULATORY_CARE_PROVIDER_SITE_OTHER): Payer: Medicaid Other | Admitting: Obstetrics and Gynecology

## 2017-08-27 ENCOUNTER — Telehealth: Payer: Self-pay | Admitting: Obstetrics and Gynecology

## 2017-08-27 NOTE — Progress Notes (Signed)
  OBSTETRICS POSTPARTUM CLINIC PROGRESS NOTE  Subjective:     Carolyn Petersen is a 27 y.o. [id number] female who presents for a postpartum visit. She is 2 weeks postpartum following a Term pregnancy and delivery by C-section emergent.  I have fully reviewed the prenatal and intrapartum course. Anesthesia: general.  Postpartum course has been complicated by uncomplicated.  Baby is feeding by Bottle.  Bleeding: patient has not  resumed menses.  Bowel function is normal. Bladder function is normal.  Patient is not sexually active. Contraception method desired is Nexplanon.  Postpartum depression screening: negative.  The following portions of the patient's history were reviewed and updated as appropriate: allergies, current medications, past family history, past medical history, past social history, past surgical history and problem list.  Review of Systems Pertinent items are noted in HPI.  Objective:    BP 102/60 (BP Location: Right Arm, Patient Position: Sitting, Cuff Size: Normal)   Pulse 72   Ht 5\' 2"  (1.575 m)   Wt 136 lb (61.7 kg)   Breastfeeding? No   BMI 24.87 kg/m   General:  alert and no distress   Breasts:  inspection negative, no nipple discharge or bleeding, no masses or nodularity palpable  Lungs: clear to auscultation bilaterally  Heart:  regular rate and rhythm, S1, S2 normal, no murmur, click, rub or gallop  Abdomen: soft, non-tender; bowel sounds normal; no masses,  no organomegaly.   Well healed Pfannenstiel incision. No induration. No drainage.   Vulva:  normal  Vagina: normal vagina, no discharge, exudate, lesion, or erythema  Cervix:  no cervical motion tenderness and no lesions  Corpus: normal size, contour, position, consistency, mobility, non-tender  Adnexa:  normal adnexa and no mass, fullness, tenderness  Rectal Exam: Not performed.          Assessment:  Post Partum Care visit There are no diagnoses linked to this encounter.  Plan:   Skin  incision healing well, no issues.  See orders and Patient Instructions Follow up in: 3 weeks for Nexplanon insertion or as needed.   Adelene Idlerhristanna Mazella Deen MD [facility], [facility] 08/27/17 1:47 PM

## 2017-08-27 NOTE — Telephone Encounter (Signed)
8/5 at 230 for naxplanon with schuman

## 2017-08-28 NOTE — Telephone Encounter (Signed)
Noted. Will order to arrive by apt date/time. 

## 2017-09-17 ENCOUNTER — Ambulatory Visit: Payer: Medicaid Other | Admitting: Obstetrics and Gynecology

## 2018-03-01 IMAGING — US US OB LIMITED
1 series · 14 of 28 positions shown · non-contrast
Comparison: none

CLINICAL DATA: Abdominal pain.  Sixteen weeks pregnant.

EXAM:
LIMITED OBSTETRIC ULTRASOUND

[Series 1: us ob limited · 0.23mm/px · 14 of 46 slices shown]
[im 2/46]
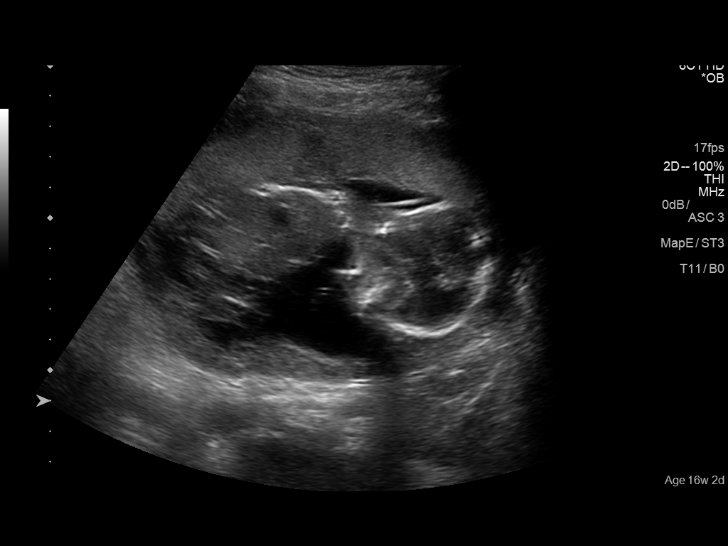
[im 6/46]
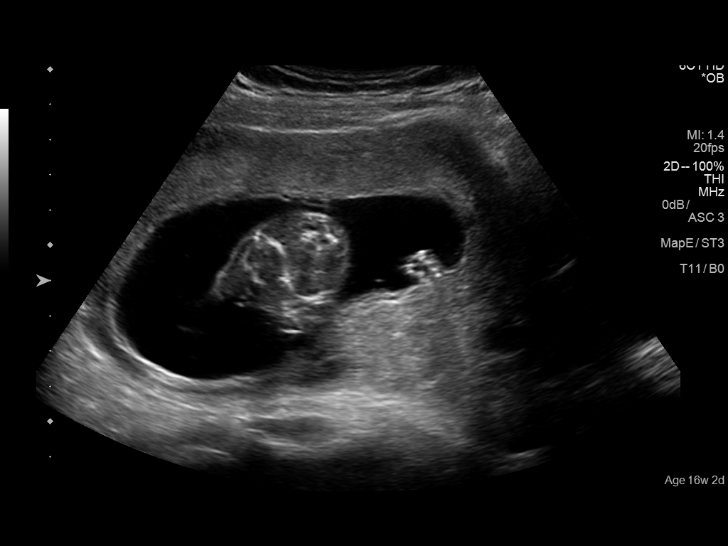
[im 9/46]
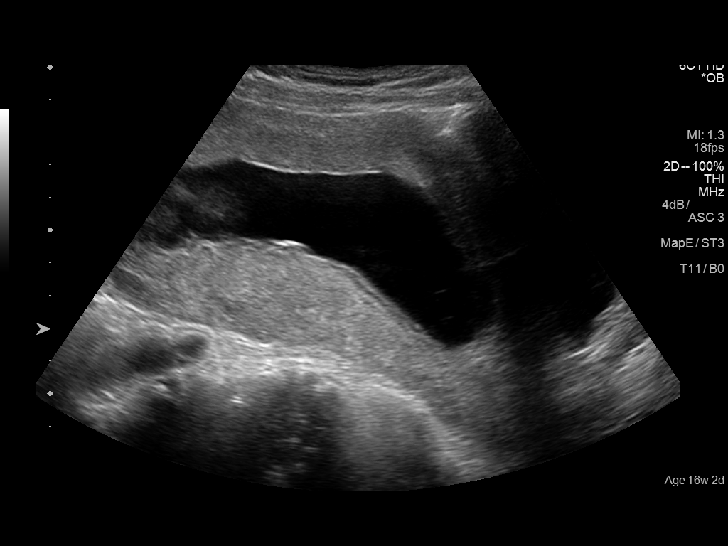
[im 12/46]
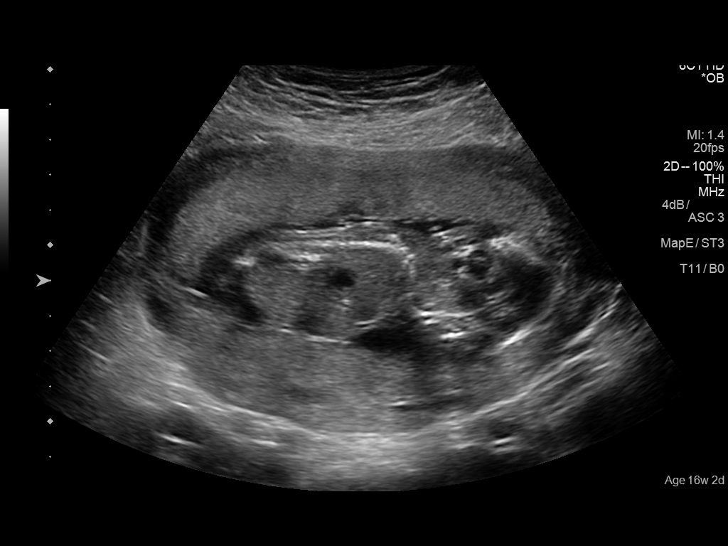
[im 16/46]
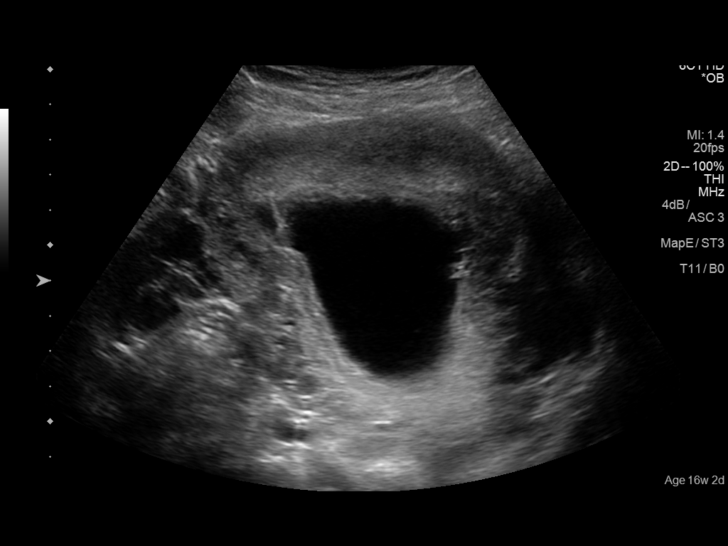
[im 19/46]
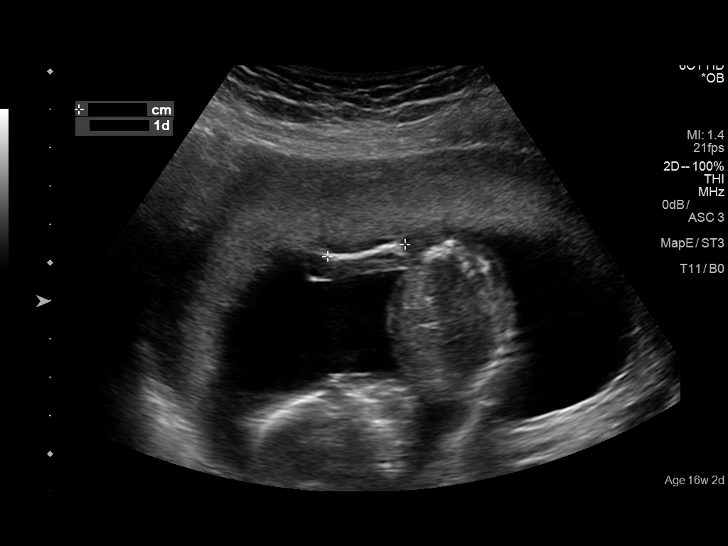
[im 22/46]
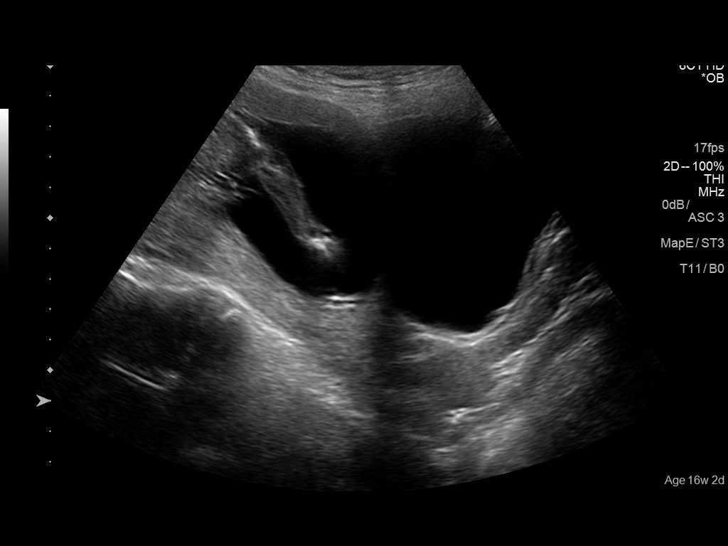
[im 26/46]
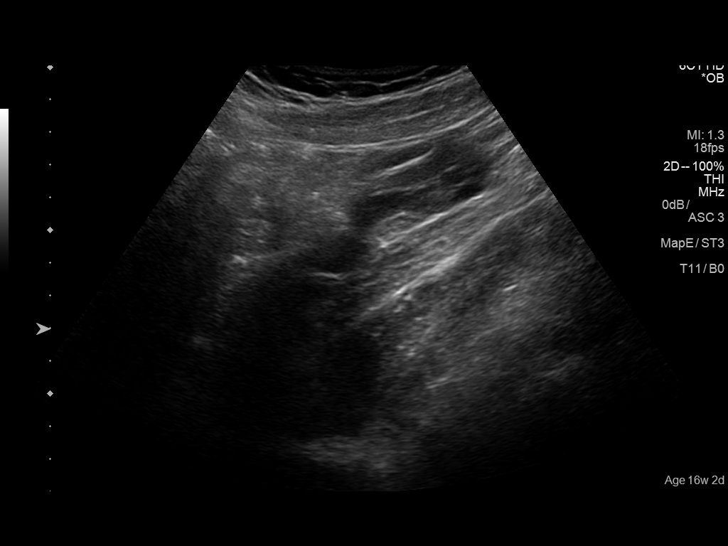
[im 29/46]
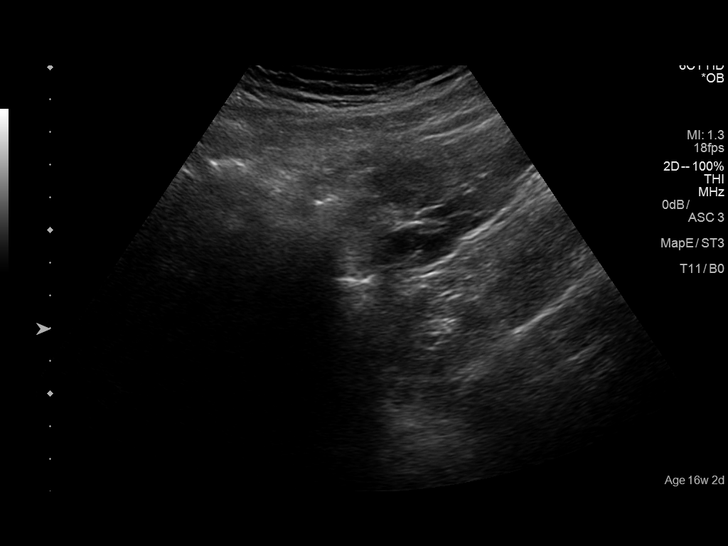
[im 32/46]
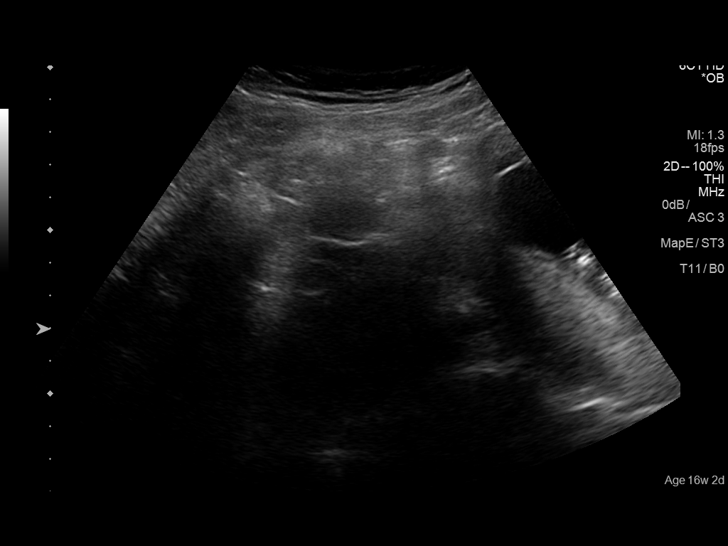
[im 36/46]
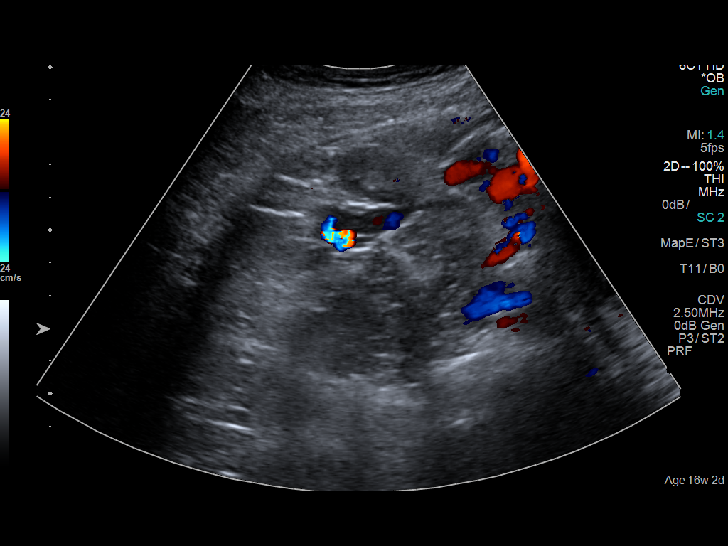
[im 39/46]
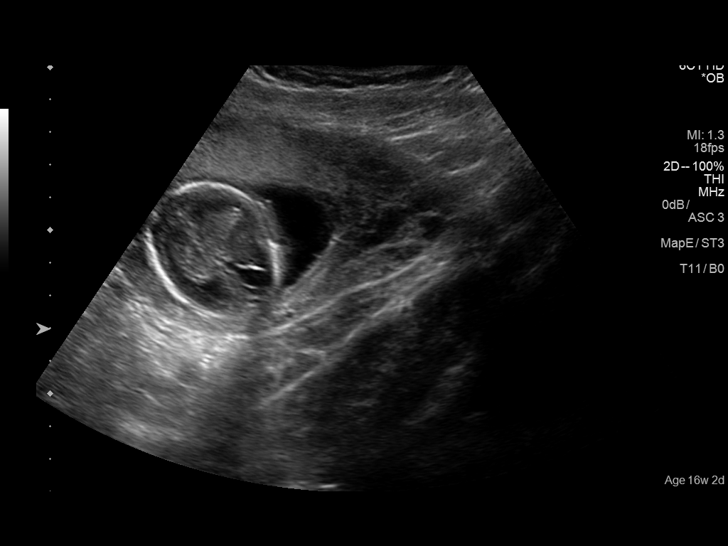
[im 42/46]
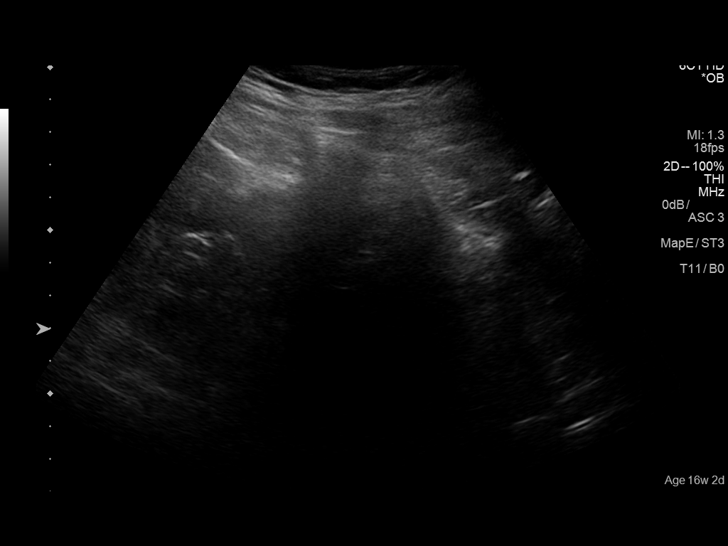
[im 46/46]
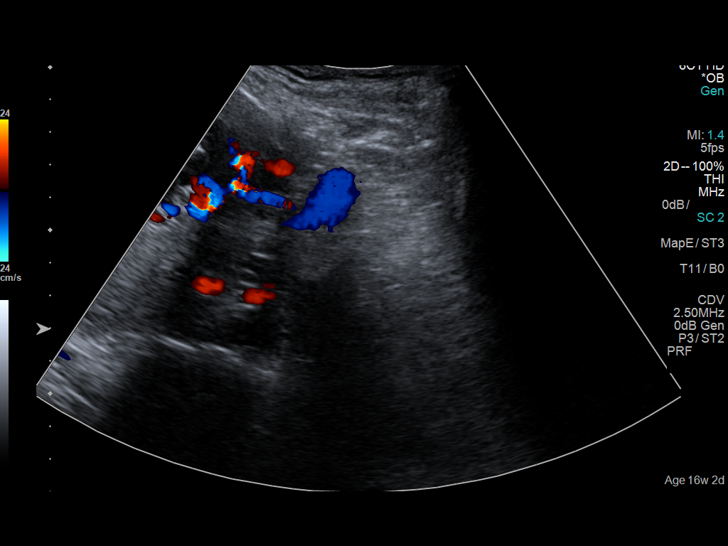

[14 of 28 positions shown; findings below may reference images not displayed]

FINDINGS: Number of Fetuses:  Single

Heart Rate:  160 bpm

Presentation: Transverse LEFT

Placental Location: Anterior

Amniotic Fluid (Subjective):  Within normal limits.

Femur length:  2.1cm 16w  1d

MATERNAL FINDINGS:

Cervix:  Appears closed.

Uterus/Adnexae:  No abnormality visualized.
IMPRESSION: 1. Single intrauterine fetus with normal heart rate and subjectively
normal amniotic fluid volume.
2. Estimated gestational age 16 weeks 1 day

This exam is performed on an emergent basis and does not
comprehensively evaluate fetal size, dating, or anatomy; follow-up
complete OB US should be considered if further fetal assessment is
warranted.

## 2018-11-11 DIAGNOSIS — R87612 Low grade squamous intraepithelial lesion on cytologic smear of cervix (LGSIL): Secondary | ICD-10-CM

## 2018-11-11 DIAGNOSIS — E663 Overweight: Secondary | ICD-10-CM

## 2018-11-12 ENCOUNTER — Encounter: Payer: Self-pay | Admitting: Family Medicine

## 2018-11-12 ENCOUNTER — Ambulatory Visit: Payer: Self-pay | Admitting: Family Medicine

## 2018-11-12 ENCOUNTER — Other Ambulatory Visit: Payer: Self-pay

## 2018-11-12 DIAGNOSIS — N912 Amenorrhea, unspecified: Secondary | ICD-10-CM

## 2018-11-12 DIAGNOSIS — Z8742 Personal history of other diseases of the female genital tract: Secondary | ICD-10-CM

## 2018-11-12 DIAGNOSIS — Z3009 Encounter for other general counseling and advice on contraception: Secondary | ICD-10-CM

## 2018-11-12 DIAGNOSIS — Z3201 Encounter for pregnancy test, result positive: Secondary | ICD-10-CM

## 2018-11-12 DIAGNOSIS — Z113 Encounter for screening for infections with a predominantly sexual mode of transmission: Secondary | ICD-10-CM

## 2018-11-12 LAB — PREGNANCY, URINE: Preg Test, Ur: POSITIVE — AB

## 2018-11-12 LAB — WET PREP FOR TRICH, YEAST, CLUE
Trichomonas Exam: NEGATIVE
Yeast Exam: NEGATIVE

## 2018-11-12 MED ORDER — PRENATAL VITAMINS 28-0.8 MG PO TABS: 1.0000 | ORAL_TABLET | Freq: Every day | ORAL | 0 refills | Status: AC

## 2018-11-12 NOTE — Progress Notes (Signed)
Here today for STD screening. Accepts bloodwork. Hal Morales, RN PT positive. Positive PT packet and PNV's given. Hal Morales, RN  Wet Prep results reviewed. Per standing orders, no treatment indicated. Hal Morales, RN

## 2018-11-12 NOTE — Progress Notes (Signed)
STI clinic/screening visit  Subjective:  Rand Boller is a 28 y.o. female being seen today for an STI screening visit. The patient reports they do not have symptoms.  Patient has the following medical conditions:   Patient Active Problem List   Diagnosis Date Noted  . History of prior pregnancy with IUGR newborn 03/26/2017  . Overweight 12/19/2016  . 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 SEXUALLY TRANSMITTED DISEASE    HPI  Patient reports   See flowsheet for further details and programmatic requirements.    The following portions of the patient's history were reviewed and updated as appropriate: allergies, current medications, past medical history, past social history, past surgical history and problem list.  Objective:  There were no vitals filed for this visit.  Physical Exam Vitals signs and nursing note reviewed.  Constitutional:      Appearance: Normal appearance.  HENT:     Head: Normocephalic and atraumatic.     Mouth/Throat:     Mouth: Mucous membranes are moist.     Pharynx: Oropharynx is clear. No oropharyngeal exudate or posterior oropharyngeal erythema.  Pulmonary:     Effort: Pulmonary effort is normal.  Abdominal:     General: Abdomen is flat.     Palpations: There is no mass.     Tenderness: There is no abdominal tenderness. There is no rebound.  Genitourinary:    General: Normal vulva.     Exam position: Lithotomy position.     Pubic Area: No rash or pubic lice.      Labia:        Right: No rash or lesion.        Left: No rash or lesion.      Vagina: Normal. No vaginal discharge, erythema, bleeding or lesions.     Cervix: Discharge (Thin, physiologic appearing. pH<4.5) present. No cervical motion tenderness, friability, lesion or erythema.     Uterus: Normal. Enlarged.      Adnexa: Right adnexa normal and left adnexa normal.     Rectum:  Normal.  Lymphadenopathy:     Head:     Right side of head: No preauricular or posterior auricular adenopathy.     Left side of head: No preauricular or posterior auricular adenopathy.     Cervical: No cervical adenopathy.     Upper Body:     Right upper body: No supraclavicular or axillary adenopathy.     Left upper body: No supraclavicular or axillary adenopathy.     Lower Body: No right inguinal adenopathy. No left inguinal adenopathy.  Skin:    General: Skin is warm and dry.     Findings: No rash.  Neurological:     Mental Status: She is alert and oriented to person, place, and time.       Assessment and Plan:  Mystie Ormand is a 28 y.o. female presenting to the [facility] Department for STI screening  1. Screening examination for venereal disease Patient accepted all screenings including vaginal CT/GC and bloodwork for HIV/RPR. Patient  meets criteria for HepC Screening and accepted screening for these today.  Treat wet prep per standing order  Discussed that GC/CT NAAT may take >1 week to result. Patient will be called with positive results and encouraged patient to call if she had not heard in 2 weeks.  Counseled on warning s/sx and when to seek care Recommended condom use  with all sex Patient is currently using Nothing to prevent pregnancy.  - Pregnancy, urine - WET PREP FOR TRICH, YEAST, CLUE - HIV [place] [facility] - IGP, Aptima HPV - HIV/HCV [place] [facility] - Syphilis Serology, [place] [facility] - Chlamydia/Gonorrhea  Lab  2. Family planning - IGP, Aptima HPV  3. History of abnormal cervical Pap smear Multiple abnormal paps, never had colpo. Last was LSIL with HRHPV pos - IGP, Aptima HPV  4. Positive urine pregnancy test Undesired pregnancy, last pregnancy was ~7 months ago and ended as TAB.  - Prenatal Vit-Fe Fumarate-FA (PRENATAL VITAMINS) 28-0.8 MG TABS; Take 1 tablet by mouth daily.  Dispense: 100 tablet; Refill: 0   Return if symptoms  worsen or fail to improve.  No future appointments.  Caren Macadam, MD

## 2018-11-19 LAB — IGP, APTIMA HPV
HPV Aptima: NEGATIVE
PAP Smear Comment: 0

## 2018-11-21 LAB — HM HEPATITIS C SCREENING LAB: HM Hepatitis Screen: NEGATIVE

## 2018-11-21 LAB — HM HIV SCREENING LAB: HM HIV Screening: NEGATIVE

## 2018-11-26 ENCOUNTER — Other Ambulatory Visit: Payer: Self-pay

## 2018-11-26 ENCOUNTER — Emergency Department
Admission: EM | Admit: 2018-11-26 | Discharge: 2018-11-26 | Disposition: A | Discharge status: Home / Self Care | Payer: Medicaid Other | Attending: Emergency Medicine | Admitting: Emergency Medicine

## 2018-11-26 DIAGNOSIS — K0889 Other specified disorders of teeth and supporting structures: Secondary | ICD-10-CM | POA: Insufficient documentation

## 2018-11-26 DIAGNOSIS — J45909 Unspecified asthma, uncomplicated: Secondary | ICD-10-CM | POA: Diagnosis not present

## 2018-11-26 DIAGNOSIS — Z79899 Other long term (current) drug therapy: Secondary | ICD-10-CM | POA: Insufficient documentation

## 2018-11-26 MED ORDER — OXYCODONE-ACETAMINOPHEN 5-325 MG PO TABS
1.0000 | ORAL_TABLET | Freq: Once | ORAL | Status: AC
  Administered 2018-11-26: 1 via ORAL
  Filled 2018-11-26: qty 1

## 2018-11-26 MED ORDER — OXYCODONE-ACETAMINOPHEN 5-325 MG PO TABS: 1.0000 | ORAL_TABLET | ORAL | 0 refills | Status: DC | PRN

## 2018-11-26 MED ORDER — LIDOCAINE VISCOUS HCL 2 % MT SOLN
15.0000 mL | Freq: Once | OROMUCOSAL | Status: AC
  Administered 2018-11-26: 15 mL via OROMUCOSAL
  Filled 2018-11-26: qty 15

## 2018-11-26 MED ORDER — AMOXICILLIN 500 MG PO CAPS
500.0000 mg | ORAL_CAPSULE | Freq: Once | ORAL | Status: AC
  Administered 2018-11-26: 500 mg via ORAL
  Filled 2018-11-26: qty 1

## 2018-11-26 MED ORDER — AMOXICILLIN 500 MG PO CAPS: 500.0000 mg | ORAL_CAPSULE | Freq: Three times a day (TID) | ORAL | 0 refills | Status: DC

## 2018-11-26 NOTE — Discharge Instructions (Signed)
1.  Take antibiotic as prescribed (amoxicillin 500 mg 3 times daily x7 days). 2.  Continue to take Tylenol as needed for pain.  Take Percocet sparingly for more severe pain. 3.  Return to the ER for worsening symptoms, persistent vomiting, difficulty breathing, fever or other concerns.

## 2018-11-26 NOTE — ED Provider Notes (Signed)
[facility] Emergency Department Provider Note   ____________________________________________   First MD Initiated Contact with Patient 11/26/18 0505     (approximate)  I have reviewed the triage vital signs and the nursing notes.   HISTORY  Chief Complaint Dental pain   HPI Carolyn Petersen is a 28 y.o. female brought to the ED from home via EMS with a chief complaint of dental pain.  Patient is G7, P5 approximately [redacted] weeks pregnant by dates who has been experiencing a 1 week history of right upper molar pain.  Has a pre-existing crack which began to hurt her last week.  Has been taking Tylenol without relief of symptoms.  Denies fever, facial swelling, nausea or vomiting.       Past Medical History:  Diagnosis Date  . Asthma   . Medical history non-contributory     Patient Active Problem List   Diagnosis Date Noted  . History of prior pregnancy with IUGR newborn 03/26/2017  . Overweight 12/19/2016  . 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 CESAREAN SECTION N/A 08/12/2017   Procedure: CESAREAN SECTION;  Surgeon: Carolyn Milch, MD;  Location: [facility] ORS;  Service: Obstetrics;  Laterality: N/A;  . NO PAST SURGERIES      Prior to Admission medications   Medication Sig Start Date End Date Taking? Authorizing Provider  amoxicillin (AMOXIL) 500 MG capsule Take 1 capsule (500 mg total) by mouth 3 (three) times daily. 11/26/18   Carolyn Hong, MD  ferrous sulfate 325 (65 FE) MG tablet Take 1 tablet (325 mg total) by mouth daily with breakfast. Patient not taking: Reported on 11/12/2018 08/14/17   Carolyn Petersen, CNM  oxyCODONE-acetaminophen (PERCOCET/ROXICET) 5-325 MG tablet Take 1 tablet by mouth every 4 (four) hours as needed for severe pain. 11/26/18   Carolyn Hong, MD  Prenatal Vit-Fe Fumarate-FA (PRENATAL MULTIVITAMIN) TABS  tablet Take 1 tablet by mouth daily at 12 noon.    [provider]  Prenatal Vit-Fe Fumarate-FA (PRENATAL VITAMINS) 28-0.8 MG TABS Take 1 tablet by mouth daily. 11/12/18 02/20/19  Carolyn Flake, MD    Allergies Patient has no known allergies.  Family History  Problem Relation Age of Onset  . Depression Mother   . Schizophrenia Mother   . Bipolar disorder Mother   . Diabetes Mother   . Liver disease Father   . Alcohol abuse Father   . Hypertension Father   . Diabetes Maternal Grandmother     Social History Social History   Tobacco Use  . Smoking status: Never Smoker  . Smokeless tobacco: Never Used  Substance Use Topics  . Alcohol use: No  . Drug use: Yes    Types: Marijuana, Cocaine    Review of Systems  Constitutional: No fever/chills Eyes: No visual changes. ENT: Positive for dentalgia.  No sore throat. Cardiovascular: Denies chest pain. Respiratory: Denies shortness of breath. Gastrointestinal: No abdominal pain.  No nausea, no vomiting.  No diarrhea.  No constipation. Genitourinary: Negative for dysuria. Musculoskeletal: Negative for back pain. Skin: Negative for rash. Neurological: Negative for headaches, focal weakness or numbness.   ____________________________________________   PHYSICAL EXAM:  VITAL SIGNS: ED Triage Vitals  Enc Vitals Group     BP      Pulse      Resp      Temp      Temp src  SpO2      Weight      Height      Head Circumference      Peak Flow      Pain Score      Pain Loc      Pain Edu?      Excl. in [place]?     Constitutional: Alert and oriented. Well appearing and in mild acute distress. Eyes: Conjunctivae are normal. PERRL. EOMI. Head: Atraumatic. Nose: No congestion/rhinnorhea. Mouth/Throat: Mucous membranes are moist.  Dental caries.  Right upper posterior molar with pre-existing crack which is tender to palpation with tongue blade.  No intraoral or extraoral swelling. Neck: No stridor.    Cardiovascular: Normal rate, regular rhythm. Grossly normal heart sounds.  Good peripheral circulation. Respiratory: Normal respiratory effort.  No retractions. Lungs CTAB. Gastrointestinal: Soft and nontender. No distention. No abdominal bruits. No CVA tenderness. Musculoskeletal: No lower extremity tenderness nor edema.  No joint effusions. Neurologic:  Normal speech and language. No gross focal neurologic deficits are appreciated. No gait instability. Skin:  Skin is warm, dry and intact. No rash noted. Psychiatric: Mood and affect are normal. Speech and behavior are normal.  ____________________________________________   LABS (all labs ordered are listed, but only abnormal results are displayed)  Labs Reviewed - No data to display ____________________________________________  EKG  None ____________________________________________  RADIOLOGY  ED MD interpretation: None  Official radiology report(s): No results found.  ____________________________________________   PROCEDURES  Procedure(s) performed (including Critical Care):  Procedures   ____________________________________________   INITIAL IMPRESSION / ASSESSMENT AND PLAN / ED COURSE  As part of my medical decision making, I reviewed the following data within the [place] notes reviewed and incorporated, Old chart reviewed, Notes from prior ED visits and [place] Controlled Substance Database     Carolyn Petersen was evaluated in Emergency Department on 11/26/2018 for the symptoms described in the history of present illness. She was evaluated in the context of the global COVID-19 pandemic, which necessitated consideration that the patient might be at risk for infection with the SARS-CoV-2 virus that causes COVID-19. Institutional protocols and algorithms that pertain to the evaluation of patients at risk for COVID-19 are in a state of rapid change based on information released by regulatory  bodies including the CDC and federal and state organizations. These policies and algorithms were followed during the patient's care in the ED.    28 year old female who presents with dentalgia.  Will start amoxicillin.  Cautioned her against taking Ibuprofen and cautioned her about taking Percocet very sparingly given her early pregnancy.  Strict return precautions given.  Patient verbalizes understanding agrees with plan of care.  Dental clinic information given to patient for follow-up.      ____________________________________________   FINAL CLINICAL IMPRESSION(S) / ED DIAGNOSES  Final diagnoses:  Pain, dental     ED Discharge Orders         Ordered    amoxicillin (AMOXIL) 500 MG capsule  3 times daily     11/26/18 0507    oxyCODONE-acetaminophen (PERCOCET/ROXICET) 5-325 MG tablet  Every 4 hours PRN     11/26/18 0507           Note:  This document was prepared using Dragon voice recognition software and may include unintentional dictation errors.   Paulette Blanch, MD 11/26/18 [phone]

## 2018-11-26 NOTE — ED Triage Notes (Signed)
Pt to ED via EMS from home. Pt c/o dental pain,

## 2018-12-04 NOTE — Addendum Note (Signed)
Addended by: Cletis Media on: 12/04/2018 03:55 PM   Modules accepted: Orders

## 2018-12-31 ENCOUNTER — Other Ambulatory Visit: Payer: Self-pay | Admitting: Family Medicine

## 2018-12-31 ENCOUNTER — Ambulatory Visit: Payer: Medicaid Other

## 2018-12-31 ENCOUNTER — Encounter: Payer: Self-pay | Admitting: Family Medicine

## 2018-12-31 ENCOUNTER — Other Ambulatory Visit: Payer: Self-pay

## 2018-12-31 VITALS — BP 108/61 | Temp 97.8°F | Wt 146.2 lb

## 2018-12-31 DIAGNOSIS — Z641 Problems related to multiparity: Secondary | ICD-10-CM | POA: Diagnosis not present

## 2018-12-31 DIAGNOSIS — Z3480 Encounter for supervision of other normal pregnancy, unspecified trimester: Secondary | ICD-10-CM | POA: Diagnosis not present

## 2018-12-31 DIAGNOSIS — Z98891 History of uterine scar from previous surgery: Secondary | ICD-10-CM

## 2018-12-31 DIAGNOSIS — Z8759 Personal history of other complications of pregnancy, childbirth and the puerperium: Secondary | ICD-10-CM | POA: Insufficient documentation

## 2018-12-31 DIAGNOSIS — K0889 Other specified disorders of teeth and supporting structures: Secondary | ICD-10-CM

## 2018-12-31 DIAGNOSIS — F1990 Other psychoactive substance use, unspecified, uncomplicated: Secondary | ICD-10-CM

## 2018-12-31 DIAGNOSIS — O0991 Supervision of high risk pregnancy, unspecified, first trimester: Secondary | ICD-10-CM | POA: Insufficient documentation

## 2018-12-31 DIAGNOSIS — J45909 Unspecified asthma, uncomplicated: Secondary | ICD-10-CM | POA: Insufficient documentation

## 2018-12-31 NOTE — Progress Notes (Signed)
Chart abstracted per phone interview 12/26/18 with Tawanna Solo, RN; Debera Lat, RN

## 2018-12-31 NOTE — Progress Notes (Signed)
Here today for 11.0 week IP appt. Taking PNV QD, denies ED/hospital visits since +PT. Declines Flu vaccine. Hal Morales, RN

## 2018-12-31 NOTE — Progress Notes (Signed)
[place] [id number] [phone]  INITIAL PRENATAL VISIT NOTE  Subjective:  Carolyn Petersen is a 28 y.o. H0W2376 at [redacted]w[redacted]d being seen today to start prenatal care at the [facility].  She is currently monitored for the following issues for this high-risk pregnancy and has History of prior pregnancy with IUGR newborn; Overweight; History of cesarean delivery; History of placental abruption; Tooth pain; Supervision of high risk pregnancy in first trimester; Substance use; and [place] multipara on their problem list.  Patient reports toothache. Denies n/v. Endorses feeling tired. PHQ-9 score 3 which she attributes to lack of energy with pregnancy, in general is feeling well. She and her partner are excited about this pregnancy.  Contractions: Not present. Vag. Bleeding: None. Denies leaking of fluid.   The following portions of the patient's history were reviewed and updated as appropriate: allergies, current medications, past family history, past medical history, past social history, past surgical history and problem list. Problem list updated.  Objective:   Vitals:   12/31/18 1353  BP: 108/61  Temp: 97.8 F (36.6 C)  Weight: 146 lb 3.2 oz (66.3 kg)    Fetal Status: Fetal Heart Rate (bpm): 160   Movement: Absent     Physical Exam Vitals signs and nursing note reviewed.  Constitutional:      General: She is not in acute distress.    Appearance: Normal appearance. She is well-developed.  HENT:     Head: Normocephalic and atraumatic.     Right Ear: External ear normal.     Left Ear: External ear normal.     Nose: Nose normal. No congestion or rhinorrhea.     Mouth/Throat:     Lips: Pink.     Mouth: Mucous membranes are moist.     Dentition: Normal dentition. No dental caries.  Eyes:     General: No scleral icterus.    Conjunctiva/sclera: Conjunctivae normal.   Neck:     Thyroid: No thyroid mass or thyromegaly.  Cardiovascular:     Rate and Rhythm: Normal rate.     Pulses: Normal pulses.     Comments: Extremities are warm and well perfused Pulmonary:     Effort: Pulmonary effort is normal.     Breath sounds: Normal breath sounds.  Chest:     Breasts: Breasts are symmetrical.        Right: Normal. No mass, nipple discharge or skin change.        Left: Normal. No mass, nipple discharge or skin change.  Abdominal:     General: Abdomen is flat.     Palpations: Abdomen is soft.     Tenderness: There is no abdominal tenderness.     Comments: Gravid   Genitourinary:    General: Normal vulva.     Exam position: Lithotomy position.     Pubic Area: No rash.      Labia:        Right: No rash.        Left: No rash.      Vagina: Normal. No vaginal discharge.     Cervix: No cervical motion tenderness or friability.     Uterus: Normal. Enlarged (Gravid 11 wks). Not tender.      Adnexa: Right adnexa normal and left adnexa normal.     Rectum: Normal. No external hemorrhoid.  Musculoskeletal:     Right lower leg: No edema.     Left  lower leg: No edema.  Lymphadenopathy:     Upper Body:     Right upper body: No axillary adenopathy.     Left upper body: No axillary adenopathy.  Skin:    General: Skin is warm.     Capillary Refill: Capillary refill takes less than 2 seconds.  Neurological:     Mental Status: She is alert.     Assessment and Plan:  Pregnancy: [id number] at [redacted]w[redacted]d   1. Supervision of high risk pregnancy in first trimester  -[place] multipara, tolerating first trimester well. -She requests genetic screening today, plans to give birth at [facility], I've filled out [facility] perinatal consult request form. Labs: - Prenatal profile without Varicella/Rubella ([id number]) - Urine Culture - Lead, blood (adult age 11 yrs or greater) - [id number] Drug Screen - Hemoglobin, venipuncture - Urinalysis (Urine Dip) - Chlamydia/GC NAA, Confirmation  2.  History of cesarean delivery Will need to discuss pt preference for TOLAC vs repeat c-section  3. History of placental abruption In 2019, this lead to cesarean at [redacted]w[redacted]d during prior preg  4. Tooth pain Advised to follow up with dentists asap. She has Medicaid and list of dentists.  5. (History of) Substance use States not currently using any substances. Endorsed 3 percocets prescribed from ER during early preg d/t tooth pain. Hx of marijuana use pre-pregnancy and cocaine use >1 yr ago. She declines counseling or support as she has been clean for awhile. Accepts UDS today.  6. [place] multipara [id number]   Discussed overview of care and coordination with inpatient delivery practices including [facility], [facility], [facility] and [facility].     Preterm labor symptoms and general obstetric precautions including but not limited to vaginal bleeding, contractions, leaking of fluid and fetal movement were reviewed in detail with the patient.  Please refer to After Visit Summary for other counseling recommendations.    Future Appointments  Date Time Provider Department Center  01/13/2019  1:00 PM [facility]ETIC [facility] None  01/13/2019  2:00 PM [facility] [place] [facility]    Miyeko Mahlum L Donique Hammonds, PA-C

## 2019-01-01 ENCOUNTER — Telehealth: Payer: Self-pay

## 2019-01-01 ENCOUNTER — Other Ambulatory Visit: Payer: Self-pay | Admitting: Family Medicine

## 2019-01-01 DIAGNOSIS — Z369 Encounter for antenatal screening, unspecified: Secondary | ICD-10-CM

## 2019-01-01 LAB — CBC/D/PLT+RPR+RH+ABO+AB SCR
Antibody Screen: NEGATIVE
Basophils Absolute: 0 10*3/uL (ref 0.0–0.2)
Basos: 0 %
EOS (ABSOLUTE): 0.5 10*3/uL — ABNORMAL HIGH (ref 0.0–0.4)
Eos: 7 %
Hematocrit: 38.5 % (ref 34.0–46.6)
Hemoglobin: 13.1 g/dL (ref 11.1–15.9)
Hepatitis B Surface Ag: NEGATIVE
Immature Grans (Abs): 0 10*3/uL (ref 0.0–0.1)
Immature Granulocytes: 0 %
Lymphocytes Absolute: 1.6 10*3/uL (ref 0.7–3.1)
Lymphs: 23 %
MCH: 30.8 pg (ref 26.6–33.0)
MCHC: 34 g/dL (ref 31.5–35.7)
MCV: 91 fL (ref 79–97)
Monocytes Absolute: 0.5 10*3/uL (ref 0.1–0.9)
Monocytes: 7 %
Neutrophils Absolute: 4.3 10*3/uL (ref 1.4–7.0)
Neutrophils: 63 %
Platelets: 254 10*3/uL (ref 150–450)
RBC: 4.25 x10E6/uL (ref 3.77–5.28)
RDW: 12.8 % (ref 11.7–15.4)
RPR Ser Ql: NONREACTIVE
Rh Factor: POSITIVE
WBC: 6.9 10*3/uL (ref 3.4–10.8)

## 2019-01-01 LAB — URINALYSIS
Bilirubin, UA: NEGATIVE
Glucose, UA: NEGATIVE
Ketones, UA: NEGATIVE
Leukocytes,UA: NEGATIVE
Nitrite, UA: NEGATIVE
Protein,UA: NEGATIVE
RBC, UA: NEGATIVE
Specific Gravity, UA: 1.01 (ref 1.005–1.030)
Urobilinogen, Ur: 0.2 mg/dL (ref 0.2–1.0)
pH, UA: 7 (ref 5.0–7.5)

## 2019-01-01 LAB — LEAD, BLOOD (ADULT >= 16 YRS): Lead-Whole Blood: <1 ug/dL (ref 0–4)

## 2019-01-01 LAB — HEMOGLOBIN, FINGERSTICK: Hemoglobin: 13.1 g/dL (ref 11.1–15.9)

## 2019-01-01 NOTE — Telephone Encounter (Signed)
Phone call to patient to inform of scheduled DP ultrasound appt. Line disconnected before RN could finish providing information. Returned phone call again, no answer. Left voicemail message with appt information. Left contact number for any questions regarding appt. Hal Morales, RN

## 2019-01-03 LAB — CHLAMYDIA/GC NAA, CONFIRMATION
Chlamydia trachomatis, NAA: NEGATIVE
Neisseria gonorrhoeae, NAA: NEGATIVE

## 2019-01-03 LAB — CANNABINOID CONFIRMATION, UR
CANNABINOIDS: POSITIVE — AB
Carboxy THC GC/MS Conf: 126 ng/mL

## 2019-01-03 LAB — 789231 7+OXYCODONE-BUND
Amphetamines, Urine: NEGATIVE ng/mL
BENZODIAZ UR QL: NEGATIVE ng/mL
Barbiturate screen, urine: NEGATIVE ng/mL
Cocaine (Metab.): NEGATIVE ng/mL
OPIATE SCREEN URINE: NEGATIVE ng/mL
Oxycodone/Oxymorphone, Urine: NEGATIVE ng/mL
PCP Quant, Ur: NEGATIVE ng/mL

## 2019-01-03 LAB — URINE CULTURE: Organism ID, Bacteria: NO GROWTH

## 2019-01-06 ENCOUNTER — Other Ambulatory Visit: Payer: Self-pay

## 2019-01-13 ENCOUNTER — Ambulatory Visit
Admission: RE | Admit: 2019-01-13 | Discharge: 2019-01-13 | Disposition: A | Discharge status: Home / Self Care | Payer: Medicaid Other | Source: Ambulatory Visit | Attending: Maternal & Fetal Medicine | Admitting: Maternal & Fetal Medicine

## 2019-01-13 ENCOUNTER — Other Ambulatory Visit: Payer: Self-pay

## 2019-01-13 ENCOUNTER — Ambulatory Visit (HOSPITAL_BASED_OUTPATIENT_CLINIC_OR_DEPARTMENT_OTHER)
Admission: RE | Admit: 2019-01-13 | Discharge: 2019-01-13 | Disposition: A | Discharge status: Home / Self Care | Payer: Medicaid Other | Source: Ambulatory Visit | Attending: Maternal & Fetal Medicine | Admitting: Maternal & Fetal Medicine

## 2019-01-13 DIAGNOSIS — Z315 Encounter for genetic counseling: Secondary | ICD-10-CM | POA: Diagnosis not present

## 2019-01-13 DIAGNOSIS — Z36 Encounter for antenatal screening for chromosomal anomalies: Secondary | ICD-10-CM | POA: Diagnosis not present

## 2019-01-13 DIAGNOSIS — Z8759 Personal history of other complications of pregnancy, childbirth and the puerperium: Secondary | ICD-10-CM

## 2019-01-13 DIAGNOSIS — K0889 Other specified disorders of teeth and supporting structures: Secondary | ICD-10-CM

## 2019-01-13 DIAGNOSIS — Z98891 History of uterine scar from previous surgery: Secondary | ICD-10-CM

## 2019-01-13 DIAGNOSIS — Z3A12 12 weeks gestation of pregnancy: Secondary | ICD-10-CM | POA: Diagnosis not present

## 2019-01-13 DIAGNOSIS — Z369 Encounter for antenatal screening, unspecified: Secondary | ICD-10-CM

## 2019-01-13 DIAGNOSIS — Z641 Problems related to multiparity: Secondary | ICD-10-CM

## 2019-01-13 NOTE — Progress Notes (Signed)
Virtual Visit via Telephone Note  I connected with Carolyn Petersen on January 13, 2019 at 10:00 AM EST by telephone and verified that I am speaking with the correct person using two identifiers.  Referring provider: [facility] Length of consultation: 30 minutes  [name]  [facility] for genetic counseling to review prenatal screening and testing options.  This note summarizes the information we discussed.    We offered the following routine screening tests for this pregnancy:  The most accurate screening option for chromosome conditions is cell free fetal DNA testing.  Though this is typically reserved for pregnancies at increased risk for aneuploidy, it is currently being made available and many insurance companies are adding coverage for this testing in low risk patients during COVID.  This test utilizes a maternal blood sample and DNA sequencing technology to isolate circulating cell free fetal DNA from maternal plasma.  The fetal DNA can then be analyzed for DNA sequences that are derived from the three most common chromosomes involved in aneuploidy, chromosomes 13, 18, and 21.  If the overall amount of DNA is greater than the expected level for any of these chromosomes, aneuploidy is suspected.  The detection rates are >99% for Down syndrome, >98% for trisomy 18 and >91% for Trisomy 13.  While we do not consider it a replacement for invasive testing and karyotype analysis, a negative result from this testing would be reassuring, though not a guarantee of a normal chromosome complement for the baby.  An abnormal result may be suggestive of an abnormal chromosome complement, though we would still recommend CVS or amniocentesis to confirm any findings from this testing.  First trimester screening, which includes nuchal translucency ultrasound screen and first trimester maternal serum marker screening, is the test that has  most recently been available for low risk patients.  The nuchal translucency has approximately an 80% detection rate for Down syndrome and can be positive for other chromosome abnormalities as well as congenital heart defects.  When combined with a maternal serum marker screening, the detection rate is up to 90% for Down syndrome and up to 97% for trisomy 18.   Given current recommendations during COVID, we are offering only the biochemical testing portion of this testing (without the ultrasound and NT portion), which has a much lower detection rate.  Maternal serum marker screening, or "quad" screen, is a blood test that measures pregnancy proteins, can provide risk assessments for Down syndrome, trisomy 18, and open neural tube defects (spina bifida, anencephaly). Because it does not directly examine the fetus, it cannot positively diagnose or rule out these problems. This is a second trimester option which could be offered along with the anatomy ultrasound. It can detect approximately 75% of babies with Down syndrome, 80% of babies with open spina bifida and 70% of babies with trisomy 1318.  Targeted ultrasound uses high frequency sound waves to create an image of the developing fetus.  An ultrasound is often recommended as a routine means of evaluating the pregnancy.  It is also used to screen for fetal anatomy problems (for example, a heart defect) that might be suggestive of a chromosomal or other abnormality. We are currently not recommending a first trimester ultrasound other than that which would be ordered for dating and viability.  Should these screening tests indicate an increased concern, then the following additional testing options would be offered:  The chorionic villus sampling procedure is available for first trimester  chromosome analysis.  This involves the withdrawal of a small amount of chorionic villi (tissue from the developing placenta).  Risk of pregnancy loss is estimated to be  approximately 1 in 200 to 1 in 100 (0.5 to 1%).  There is approximately a 1% (1 in 100) chance that the CVS chromosome results will be unclear.  Chorionic villi cannot be tested for neural tube defects.     Amniocentesis involves the removal of a small amount of amniotic fluid from the sac surrounding the fetus with the use of a thin needle inserted through the maternal abdomen and uterus.  Ultrasound guidance is used throughout the procedure.  Fetal cells from amniotic fluid are directly evaluated and > 99.5% of chromosome problems and > 98% of open neural tube defects can be detected. This procedure is generally performed after the 15th week of pregnancy.  The main risks to this procedure include complications leading to miscarriage in less than 1 in 200 cases (0.5%).  Cystic Fibrosis and Spinal Muscular Atrophy (SMA) screening were also discussed with the patient. Both conditions are recessive, which means that both parents must be carriers in order to have a child with the disease.  Cystic fibrosis (CF) is one of the most common genetic conditions in persons of Caucasian ancestry.  This condition occurs in approximately 1 in 2,500 Caucasian persons and results in thickened secretions in the lungs, digestive, and reproductive systems.  For a baby to be at risk for having CF, both of the parents must be carriers for this condition.  Approximately 1 in 59 Caucasian persons is a carrier for CF.  Current carrier testing looks for the most common mutations in the gene for CF and can detect approximately 90% of carriers in the Caucasian population.  This means that the carrier screening can greatly reduce, but cannot eliminate, the chance for an individual to have a child with CF.  If an individual is found to be a carrier for CF, then carrier testing would be available for the partner. As part of [place] newborn screening profile, all babies born in the state of [place] will have a two-tier  screening process.  Specimens are first tested to determine the concentration of immunoreactive trypsinogen (IRT).  The top 5% of specimens with the highest IRT values then undergo DNA testing using a panel of over 40 common CF mutations. SMA is a neurodegenerative disorder that leads to atrophy of skeletal muscle and overall weakness.  This condition is also more prevalent in the Caucasian population, with 1 in 40-1 in 60 persons being a carrier and 1 in 6,000-1 in 10,000 children being affected.  There are multiple forms of the disease, with some causing death in infancy to other forms with survival into adulthood.  The genetics of SMA is complex, but carrier screening can detect up to 95% of carriers in the Caucasian population.  Similar to CF, a negative result can greatly reduce, but cannot eliminate, the chance to have a child with SMA. The patient declined carrier screening for CF and SMA.  We talked about the option of signing up for Early Check to have the baby tested for SMA after delivery as part of a new study in [place].  This registration can be done online prior to delivery if desired.  We obtained a detailed family history and pregnancy history.  Carolyn Petersen stated that this is her seventh pregnancy.  She has two daughters, ages 22 and 5 years from prior relationships.  She and her current partner have three sons (ages 30, 3 and 1 year) and had one elective termination for personal reasons. All of the children are reported to be in good health, with normal growth and development. She stated that her mother has mental health concerns and that she and her siblings may have episodes of depression.  We discussed the likely multifactorial inheritance of mental health conditions.  She also reported that her mother had recent treatment for breast cancer in her late 32s and that one of her sisters had cancer treatment for possibly cervical cancer in her 30s. She was going to ask her family for clarification.   We reviewed that most cancers occur by chance, but that when cancers occur at younger than expected ages or when certain cancers occur together in family members, there may an increased concern for an inherited predisposition.  We encouraged her to speak with her doctor about this history and follow screening recommendations.  If more is learned about the family history, we are happy to discuss this further.  The remainder of the family history was reported to be unremarkable for birth defects, intellectual delays, recurrent pregnancy loss or known chromosome abnormalities.  In the current pregnancy, Carolyn Petersen reported no complications or exposures that would be expected to increase the risk for birth defects.  After consideration of the options, Carolyn Petersen elected to have MaterniT21 PLUS with SCA drawn following her dating ultrasound at [facility] on 01/13/2019. This will minimize the number of visits that she needs to attend in person.  The patient declined carrier testing for CF and SMA.  The patient was encouraged to call with questions or concerns.  We can be contacted at [phone].  Labs ordered: MaterniT21 PLUS with SCA  Cherly Anderson, MS, CGC  I provided 30 minutes of non-face-to-face time during this encounter.   Katrina Stack

## 2019-01-16 ENCOUNTER — Encounter: Payer: Self-pay | Admitting: Family Medicine

## 2019-01-16 DIAGNOSIS — Z8759 Personal history of other complications of pregnancy, childbirth and the puerperium: Secondary | ICD-10-CM

## 2019-01-16 DIAGNOSIS — Z641 Problems related to multiparity: Secondary | ICD-10-CM

## 2019-01-16 DIAGNOSIS — Z98891 History of uterine scar from previous surgery: Secondary | ICD-10-CM

## 2019-01-16 DIAGNOSIS — K0889 Other specified disorders of teeth and supporting structures: Secondary | ICD-10-CM

## 2019-01-16 DIAGNOSIS — O0991 Supervision of high risk pregnancy, unspecified, first trimester: Secondary | ICD-10-CM

## 2019-01-16 NOTE — Progress Notes (Signed)
Updated pregnancy episode with 12 wk [place]

## 2019-01-19 LAB — MATERNIT21 PLUS CORE+SCA
Fetal Fraction: 12
Monosomy X (Turner Syndrome): NOT DETECTED
Result (T21): NEGATIVE
Trisomy 13 (Patau syndrome): NEGATIVE
Trisomy 18 (Edwards syndrome): NEGATIVE
Trisomy 21 (Down syndrome): NEGATIVE
XXX (Triple X Syndrome): NOT DETECTED
XXY (Klinefelter Syndrome): NOT DETECTED
XYY (Jacobs Syndrome): NOT DETECTED

## 2019-01-20 ENCOUNTER — Telehealth: Payer: Self-pay | Admitting: Obstetrics and Gynecology

## 2019-01-20 NOTE — Telephone Encounter (Signed)
The patient was informed of the results of her recent MaterniT21 testing which yielded NEGATIVE results.  The patient's specimen showed DNA consistent with two copies of chromosomes 21, 18 and 13.  The sensitivity for trisomy 21, trisomy 18 and trisomy 13 using this testing are reported as 99.1%, 99.9% and 91.7% respectively.  Thus, while the results of this testing are highly accurate, they are not considered diagnostic at this time.  Should more definitive information be desired, the patient may still consider amniocentesis.   As requested to know by the patient, sex chromosome analysis was included for this sample.  Results are consistent with a female fetus. This is predicted with >99% accuracy.  A maternal serum AFP only should be considered if screening for neural tube defects is desired.  We may be reached at [phone] with any questions or concerns.   Tida Saner F. Lorn Butcher, MS, CGC   

## 2019-02-20 ENCOUNTER — Other Ambulatory Visit: Payer: Self-pay

## 2019-02-20 DIAGNOSIS — O365990 Maternal care for other known or suspected poor fetal growth, unspecified trimester, not applicable or unspecified: Secondary | ICD-10-CM

## 2019-02-24 ENCOUNTER — Inpatient Hospital Stay
Admission: RE | Admit: 2019-02-24 | Discharge: 2019-02-24 | Disposition: A | Discharge status: Home / Self Care | Payer: Medicaid Other | Source: Ambulatory Visit

## 2019-02-24 NOTE — ED Notes (Signed)
Pt was a "No Show" for her appt at [facility] today.

## 2019-02-25 ENCOUNTER — Ambulatory Visit: Payer: Self-pay

## 2019-03-05 ENCOUNTER — Ambulatory Visit: Payer: Self-pay

## 2019-03-06 ENCOUNTER — Other Ambulatory Visit: Payer: Self-pay

## 2019-03-06 DIAGNOSIS — Z3A20 20 weeks gestation of pregnancy: Secondary | ICD-10-CM

## 2019-03-09 ENCOUNTER — Other Ambulatory Visit: Payer: Self-pay

## 2019-03-09 ENCOUNTER — Observation Stay
Admission: EM | Admit: 2019-03-09 | Discharge: 2019-03-09 | Disposition: A | Discharge status: Home / Self Care | Payer: Medicaid Other

## 2019-03-09 ENCOUNTER — Encounter: Payer: Self-pay | Admitting: Obstetrics & Gynecology

## 2019-03-09 DIAGNOSIS — Z8249 Family history of ischemic heart disease and other diseases of the circulatory system: Secondary | ICD-10-CM | POA: Insufficient documentation

## 2019-03-09 DIAGNOSIS — Z803 Family history of malignant neoplasm of breast: Secondary | ICD-10-CM | POA: Insufficient documentation

## 2019-03-09 DIAGNOSIS — Z811 Family history of alcohol abuse and dependence: Secondary | ICD-10-CM | POA: Diagnosis not present

## 2019-03-09 DIAGNOSIS — O99512 Diseases of the respiratory system complicating pregnancy, second trimester: Secondary | ICD-10-CM | POA: Insufficient documentation

## 2019-03-09 DIAGNOSIS — Z3A20 20 weeks gestation of pregnancy: Secondary | ICD-10-CM | POA: Insufficient documentation

## 2019-03-09 DIAGNOSIS — Z87891 Personal history of nicotine dependence: Secondary | ICD-10-CM | POA: Insufficient documentation

## 2019-03-09 DIAGNOSIS — Z641 Problems related to multiparity: Secondary | ICD-10-CM

## 2019-03-09 DIAGNOSIS — B9689 Other specified bacterial agents as the cause of diseases classified elsewhere: Secondary | ICD-10-CM | POA: Diagnosis not present

## 2019-03-09 DIAGNOSIS — Z8379 Family history of other diseases of the digestive system: Secondary | ICD-10-CM | POA: Diagnosis not present

## 2019-03-09 DIAGNOSIS — Z0371 Encounter for suspected problem with amniotic cavity and membrane ruled out: Principal | ICD-10-CM | POA: Insufficient documentation

## 2019-03-09 DIAGNOSIS — Z8759 Personal history of other complications of pregnancy, childbirth and the puerperium: Secondary | ICD-10-CM

## 2019-03-09 DIAGNOSIS — O42912 Preterm premature rupture of membranes, unspecified as to length of time between rupture and onset of labor, second trimester: Secondary | ICD-10-CM | POA: Diagnosis present

## 2019-03-09 DIAGNOSIS — O4290 Premature rupture of membranes, unspecified as to length of time between rupture and onset of labor, unspecified weeks of gestation: Secondary | ICD-10-CM | POA: Diagnosis present

## 2019-03-09 DIAGNOSIS — Z818 Family history of other mental and behavioral disorders: Secondary | ICD-10-CM | POA: Insufficient documentation

## 2019-03-09 DIAGNOSIS — J45909 Unspecified asthma, uncomplicated: Secondary | ICD-10-CM | POA: Insufficient documentation

## 2019-03-09 DIAGNOSIS — O23592 Infection of other part of genital tract in pregnancy, second trimester: Secondary | ICD-10-CM | POA: Diagnosis not present

## 2019-03-09 DIAGNOSIS — K0889 Other specified disorders of teeth and supporting structures: Secondary | ICD-10-CM

## 2019-03-09 DIAGNOSIS — Z98891 History of uterine scar from previous surgery: Secondary | ICD-10-CM

## 2019-03-09 DIAGNOSIS — Z833 Family history of diabetes mellitus: Secondary | ICD-10-CM | POA: Insufficient documentation

## 2019-03-09 LAB — URINALYSIS, COMPLETE (UACMP) WITH MICROSCOPIC
Bacteria, UA: NONE SEEN
Bilirubin Urine: NEGATIVE
Glucose, UA: NEGATIVE mg/dL
Hgb urine dipstick: NEGATIVE
Ketones, ur: NEGATIVE mg/dL
Leukocytes,Ua: NEGATIVE
Nitrite: NEGATIVE
Protein, ur: NEGATIVE mg/dL
Specific Gravity, Urine: 1.006 (ref 1.005–1.030)
WBC, UA: NONE SEEN WBC/hpf (ref 0–5)
pH: 7 (ref 5.0–8.0)

## 2019-03-09 LAB — WET PREP, GENITAL
Sperm: NONE SEEN
Trich, Wet Prep: NONE SEEN
Yeast Wet Prep HPF POC: NONE SEEN

## 2019-03-09 LAB — RUPTURE OF MEMBRANE (ROM)PLUS: Rom Plus: NEGATIVE

## 2019-03-09 LAB — GROUP B STREP BY PCR: Group B strep by PCR: NEGATIVE

## 2019-03-09 LAB — CHLAMYDIA/NGC RT PCR (ARMC ONLY)
Chlamydia Tr: NOT DETECTED
N gonorrhoeae: NOT DETECTED

## 2019-03-09 MED ORDER — METRONIDAZOLE 500 MG PO TABS: 500.0000 mg | ORAL_TABLET | Freq: Two times a day (BID) | ORAL | 0 refills | Status: AC

## 2019-03-09 NOTE — ED Triage Notes (Signed)
Pt transported to L&D. Report given to [facility], Surgical tech.

## 2019-03-09 NOTE — Discharge Summary (Signed)
Carolyn Petersen is a 29 y.o. female. She is at [redacted]w[redacted]d gestation. Patient's last menstrual period was 10/15/2018 (exact date). Estimated Date of Delivery: 07/22/19  Prenatal care site:  [facility]   Chief complaint: leakage of fluid   States that she was at home and noticed new onset leakage of fluid.  Reports that fluid looked white and "slimy."    S: Resting comfortably. Denies ctx, VB, recent intercourse. Active fetal movement.  Maternal Medical History:  Past Medical Hx:  has a past medical history of Anemia, Asthma, and Vaginal Pap smear, abnormal.    Past Surgical Hx:  has a past surgical history that includes Cesarean section (N/A, 08/12/2017).   No Known Allergies   Prior to Admission medications   Medication Sig Start Date End Date Taking? Authorizing Provider  Prenatal Vit-Fe Fumarate-FA (PRENATAL MULTIVITAMIN) TABS tablet Take 1 tablet by mouth daily at 12 noon.   Yes [provider]  metroNIDAZOLE (FLAGYL) 500 MG tablet Take 1 tablet (500 mg total) by mouth 2 (two) times daily for 7 days. 03/09/19 03/16/19  Gustavo Lah, CNM    Social History: She  reports that she has quit smoking. She has never used smokeless tobacco. She reports previous alcohol use. She reports previous drug use. Drugs: Marijuana and Cocaine.  Family History: family history includes Alcohol abuse in her father; Bipolar disorder in her mother; Breast cancer in her mother; Depression in her mother; Diabetes in her maternal grandmother and mother; Hypertension in her father; Liver disease in her father; Schizophrenia in her mother.   Review of Systems: A full review of systems was performed and negative except as noted in the HPI.    O:  Ht 5\' 2"  (1.575 m)   Wt 68.9 kg   LMP 10/15/2018 (Exact Date)   BMI 27.80 kg/m  Results for orders placed or performed during the hospital encounter of 03/09/19 (from the past 48 hour(s))  Wet prep, genital   Collection Time: 03/09/19  3:30 PM   Specimen:  Vaginal  Result Value Ref Range   Yeast Wet Prep HPF POC NONE SEEN NONE SEEN   Trich, Wet Prep NONE SEEN NONE SEEN   Clue Cells Wet Prep HPF POC PRESENT (A) NONE SEEN   WBC, Wet Prep HPF POC TOO NUMEROUS TO COUNT (A) NONE SEEN   Sperm NONE SEEN   ROM Plus ([facility] only)   Collection Time: 03/09/19  3:30 PM  Result Value Ref Range   Rom Plus NEGATIVE   Urinalysis, Complete w Microscopic   Collection Time: 03/09/19  3:30 PM  Result Value Ref Range   Color, Urine STRAW (A) YELLOW   APPearance CLEAR (A) CLEAR   Specific Gravity, Urine 1.006 1.005 - 1.030   pH 7.0 5.0 - 8.0   Glucose, UA NEGATIVE NEGATIVE mg/dL   Hgb urine dipstick NEGATIVE NEGATIVE   Bilirubin Urine NEGATIVE NEGATIVE   Ketones, ur NEGATIVE NEGATIVE mg/dL   Protein, ur NEGATIVE NEGATIVE mg/dL   Nitrite NEGATIVE NEGATIVE   Leukocytes,Ua NEGATIVE NEGATIVE   WBC, UA NONE SEEN 0 - 5 WBC/hpf   Bacteria, UA NONE SEEN NONE SEEN   Squamous Epithelial / LPF 0-5 0 - 5    Constitutional: NAD, AAOx3  HE/ENT: extraocular movements grossly intact, moist mucous membranes CV: RRR PULM: nl respiratory effort, CTABL     Abd: gravid, non-tender, non-distended, soft      Ext: Non-tender, Nonedmeatous   Psych: mood appropriate, speech normal Pelvic: large amount of thin, white,  non odorous discharge   Fetal monitoring: Doppler: 150 bpm distinguished from maternal pulse  Toco: irregular uterine irritability.   Assessment: 29 y.o. [redacted]w[redacted]d here for antenatal surveillance during pregnancy.  Principle diagnosis: Bacterial vaginosis   Plan:  Labor: not present.   Fetal Wellbeing: Doppler appropriate for gestation age   Membranes: Not ruptured, rom + negative   Rx for Flagyl - take PO BID for 7 days   D/c home stable, precautions reviewed, follow-up as scheduled.   ----- Drinda Butts, CNM Certified Nurse Midwife [facility]

## 2019-03-10 ENCOUNTER — Inpatient Hospital Stay
Admission: RE | Admit: 2019-03-10 | Discharge: 2019-03-10 | Disposition: A | Discharge status: Home / Self Care | Payer: Medicaid Other | Source: Ambulatory Visit | Attending: Maternal & Fetal Medicine | Admitting: Maternal & Fetal Medicine

## 2019-03-10 NOTE — ED Notes (Signed)
Pt was a "No Show" for her appt at [facility] today. 

## 2019-03-17 ENCOUNTER — Other Ambulatory Visit: Payer: Self-pay

## 2019-03-17 DIAGNOSIS — Z8759 Personal history of other complications of pregnancy, childbirth and the puerperium: Secondary | ICD-10-CM

## 2019-03-18 ENCOUNTER — Ambulatory Visit: Payer: Medicaid Other | Admitting: Advanced Practice Midwife

## 2019-03-18 ENCOUNTER — Other Ambulatory Visit: Payer: Self-pay

## 2019-03-18 VITALS — BP 109/69 | HR 99 | Wt 152.0 lb

## 2019-03-18 DIAGNOSIS — Z641 Problems related to multiparity: Secondary | ICD-10-CM

## 2019-03-18 DIAGNOSIS — Z8759 Personal history of other complications of pregnancy, childbirth and the puerperium: Secondary | ICD-10-CM | POA: Diagnosis not present

## 2019-03-18 DIAGNOSIS — R825 Elevated urine levels of drugs, medicaments and biological substances: Secondary | ICD-10-CM

## 2019-03-18 DIAGNOSIS — O0990 Supervision of high risk pregnancy, unspecified, unspecified trimester: Secondary | ICD-10-CM

## 2019-03-18 DIAGNOSIS — E663 Overweight: Secondary | ICD-10-CM

## 2019-03-18 DIAGNOSIS — O0991 Supervision of high risk pregnancy, unspecified, first trimester: Secondary | ICD-10-CM

## 2019-03-18 DIAGNOSIS — K0889 Other specified disorders of teeth and supporting structures: Secondary | ICD-10-CM

## 2019-03-18 DIAGNOSIS — F1990 Other psychoactive substance use, unspecified, uncomplicated: Secondary | ICD-10-CM | POA: Diagnosis not present

## 2019-03-18 DIAGNOSIS — Z98891 History of uterine scar from previous surgery: Secondary | ICD-10-CM

## 2019-03-18 LAB — URINALYSIS
Bilirubin, UA: NEGATIVE
Glucose, UA: NEGATIVE
Ketones, UA: NEGATIVE
Leukocytes,UA: NEGATIVE
Nitrite, UA: NEGATIVE
Protein,UA: NEGATIVE
RBC, UA: NEGATIVE
Specific Gravity, UA: 1.015 (ref 1.005–1.030)
Urobilinogen, Ur: 0.2 mg/dL (ref 0.2–1.0)
pH, UA: 8.5 — ABNORMAL HIGH (ref 5.0–7.5)

## 2019-03-18 NOTE — Progress Notes (Signed)
Urine dip reviewed. Richmond Campbell, RN

## 2019-03-18 NOTE — Progress Notes (Signed)
Discussed AFP; patient declines. Reports recent ER visit; was told she had BV but she can't take MTZ. "Those pills make me sick". Can patient have metrogel. Richmond Campbell, RN

## 2019-03-18 NOTE — Progress Notes (Signed)
   PRENATAL VISIT NOTE  Subjective:  Carolyn Petersen is a 29 y.o. G7P5015 at [redacted]w[redacted]d being seen today for ongoing prenatal care.  She is currently monitored for the following issues for this high-risk pregnancy and has History of prior pregnancy with IUGR newborn; Overweight; History of cesarean delivery--desires TOLAC; History of placental abruption; Tooth pain; Supervision of high risk pregnancy in first trimester; Substance use; [place] multipara; and Encounter for antenatal screening for chromosomal anomalies on their problem list.  Patient reports pt states went to ER for fluid coming out of vagina after bath 1-2 wks ago and given Metronidazole but doesn't like the taste and wants gel in vagina instead.  Contractions: Not present. Vag. Bleeding: None.  Movement: Present. Denies leaking of fluid/ROM.   The following portions of the patient's history were reviewed and updated as appropriate: allergies, current medications, past family history, past medical history, past social history, past surgical history and problem list. Problem list updated.  Objective:   Vitals:   03/18/19 1111  BP: 109/69  Pulse: 99  Weight: 152 lb (68.9 kg)    Fetal Status:   Fundal Height: 22 cm Movement: Present     General:  Alert, oriented and cooperative. Patient is in no acute distress.  Skin: Skin is warm and dry. No rash noted.   Cardiovascular: Normal heart rate noted  Respiratory: Normal respiratory effort, no problems with respiration noted  Abdomen: Soft, gravid, appropriate for gestational age.  Pain/Pressure: Absent     Pelvic: Cervical exam deferred        Extremities: Normal range of motion.  Edema: None  Mental Status: Normal mood and affect. Normal behavior. Normal judgment and thought content.   Assessment and Plan:  Pregnancy: G7P5015 at [redacted]w[redacted]d  1. History of cesarean delivery Pt desires TOLAC with KC--will need delivery plans appt  2. History of placental abruption With +cocaine on  UDS  3. Tooth pain Pt states resolved because went to ER early in pregnancy and they gave her antibiotics  4. [place] multipara [id number] - Urinalysis (Urine Dip)  5. Substance use States last used MJ early in pregnancy, early November.  Denies ever using cocaine and doesn't know how she had +UDS in L&D with abruption.  Agrees to UDS today - [id number] Drug Screen  6. Overweight 2 lb (0.907 kg). BMI=27.8  7.  Supervision of high risk pregnancy Feels ok.  Declines AFP only.  States has u/s scheduled 03/20/19.  States decreased appetite and only eats 1x/day--suggestions given to eat high protein snack q 2-3 hours.  States sometimes she drinks Ensure.  Has food at home Interested in [place] pp for birth control.  States takes 3 hour baths daily and 1-2 wks ago after bath she felt liquid drip out of vagina and went to ER who gave her Metronidazole but she didn't like the taste of pills and now wants Metrogel.  Offered wet mount to pt, who is now asymptomatic, and she declines and states she will try Metronidazole again.   Preterm labor symptoms and general obstetric precautions including but not limited to vaginal bleeding, contractions, leaking of fluid and fetal movement were reviewed in detail with the patient. Please refer to After Visit Summary for other counseling recommendations.  No follow-ups on file.  Future Appointments  Date Time Provider Department Center  03/20/2019 11:00 AM [facility] [place] [facility]    Alberteen Spindle, [place]

## 2019-03-20 ENCOUNTER — Ambulatory Visit
Admission: RE | Admit: 2019-03-20 | Discharge: 2019-03-20 | Disposition: A | Discharge status: Home / Self Care | Payer: Medicaid Other | Source: Ambulatory Visit | Attending: Obstetrics and Gynecology | Admitting: Obstetrics and Gynecology

## 2019-03-20 ENCOUNTER — Other Ambulatory Visit: Payer: Self-pay

## 2019-03-20 DIAGNOSIS — Z3492 Encounter for supervision of normal pregnancy, unspecified, second trimester: Secondary | ICD-10-CM | POA: Insufficient documentation

## 2019-03-20 DIAGNOSIS — Z3A20 20 weeks gestation of pregnancy: Secondary | ICD-10-CM

## 2019-03-20 DIAGNOSIS — Z3A22 22 weeks gestation of pregnancy: Secondary | ICD-10-CM | POA: Diagnosis not present

## 2019-03-24 LAB — 789231 7+OXYCODONE-BUND
Amphetamines, Urine: NEGATIVE ng/mL
BENZODIAZ UR QL: NEGATIVE ng/mL
Barbiturate screen, urine: NEGATIVE ng/mL
Cocaine (Metab.): NEGATIVE ng/mL
OPIATE SCREEN URINE: NEGATIVE ng/mL
Oxycodone/Oxymorphone, Urine: NEGATIVE ng/mL
PCP Quant, Ur: NEGATIVE ng/mL

## 2019-03-24 LAB — CANNABINOID CONFIRMATION, UR
CANNABINOIDS: POSITIVE — AB
Carboxy THC GC/MS Conf: 95 ng/mL

## 2019-04-01 NOTE — Telephone Encounter (Signed)
No SHOW

## 2019-04-11 ENCOUNTER — Ambulatory Visit: Payer: Self-pay

## 2019-04-18 ENCOUNTER — Ambulatory Visit: Payer: Self-pay

## 2019-05-12 ENCOUNTER — Telehealth: Payer: Self-pay

## 2019-05-12 NOTE — Telephone Encounter (Signed)
Needs f/u visit; no answer, left voicemail message Sharlette Dense, RN

## 2019-05-13 NOTE — Telephone Encounter (Signed)
Call to client and [facility] appt scheduled for 05/21/2019 with arrival time of 10:20 am. Jossie Ng, RN

## 2019-05-21 ENCOUNTER — Ambulatory Visit: Payer: Self-pay

## 2019-05-29 ENCOUNTER — Encounter: Payer: Self-pay | Admitting: Physician Assistant

## 2019-05-29 ENCOUNTER — Ambulatory Visit: Payer: Medicaid Other | Admitting: Physician Assistant

## 2019-05-29 ENCOUNTER — Other Ambulatory Visit: Payer: Self-pay

## 2019-05-29 DIAGNOSIS — K0889 Other specified disorders of teeth and supporting structures: Secondary | ICD-10-CM

## 2019-05-29 DIAGNOSIS — O0990 Supervision of high risk pregnancy, unspecified, unspecified trimester: Secondary | ICD-10-CM | POA: Diagnosis not present

## 2019-05-29 DIAGNOSIS — R825 Elevated urine levels of drugs, medicaments and biological substances: Secondary | ICD-10-CM

## 2019-05-29 DIAGNOSIS — F1990 Other psychoactive substance use, unspecified, uncomplicated: Secondary | ICD-10-CM

## 2019-05-29 DIAGNOSIS — Z8759 Personal history of other complications of pregnancy, childbirth and the puerperium: Secondary | ICD-10-CM | POA: Diagnosis not present

## 2019-05-29 DIAGNOSIS — O0991 Supervision of high risk pregnancy, unspecified, first trimester: Secondary | ICD-10-CM

## 2019-05-29 DIAGNOSIS — Z98891 History of uterine scar from previous surgery: Secondary | ICD-10-CM | POA: Diagnosis not present

## 2019-05-29 DIAGNOSIS — Z23 Encounter for immunization: Secondary | ICD-10-CM

## 2019-05-29 DIAGNOSIS — Z641 Problems related to multiparity: Secondary | ICD-10-CM

## 2019-05-29 LAB — OB RESULTS CONSOLE HIV ANTIBODY (ROUTINE TESTING): HIV: NONREACTIVE

## 2019-05-29 LAB — HEMOGLOBIN, FINGERSTICK: Hemoglobin: 12.6 g/dL (ref 11.1–15.9)

## 2019-05-29 MED ORDER — PROMETHAZINE HCL 25 MG PO TABS: 25.0000 mg | ORAL_TABLET | Freq: Four times a day (QID) | ORAL | 2 refills | Status: DC | PRN

## 2019-05-29 NOTE — Progress Notes (Signed)
   PRENATAL VISIT NOTE  Subjective:  Carolyn Petersen is a 29 y.o. [id number] at [redacted]w[redacted]d being seen today for ongoing prenatal care.  She is currently monitored for the following issues for this high-risk pregnancy and has History of prior pregnancy with IUGR newborn; Overweight; History of cesarean delivery--desires TOLAC; History of placental abruption; Tooth pain; Supervision of high risk pregnancy in first trimester; Substance use; Grand multipara; Encounter for antenatal screening for chromosomal anomalies; and Positive urine drug screen 12/31/18 MJ, 03/18/19 MJ on their problem list.  Patient reports nausea with some vomiting for 2-3 weeks. Eating yogurt, fruit, and drinking juice. Missed appointments due to transportation/childcare issues. Denies any drug use in months.  Contractions: Not present. Vag. Bleeding: None.  Movement: Present. Denies leaking of fluid/ROM.   The following portions of the patient's history were reviewed and updated as appropriate: allergies, current medications, past family history, past medical history, past social history, past surgical history and problem list. Problem list updated.  Objective:   Vitals:   05/29/19 1516  BP: 122/79  Pulse: 73  Temp: (!) 97.3 F (36.3 C)  Weight: 155 lb 6.4 oz (70.5 kg)    Fetal Status: Fetal Heart Rate (bpm): 136 Fundal Height: 31 cm Movement: Present     General:  Alert, oriented and cooperative. Patient is in no acute distress.  Skin: Skin is warm and dry. No rash noted.   Cardiovascular: Normal heart rate noted  Respiratory: Normal respiratory effort, no problems with respiration noted  Abdomen: Soft, gravid, appropriate for gestational age.  Pain/Pressure: Absent     Pelvic: Cervical exam deferred        Extremities: Normal range of motion.  Edema: None  Mental Status: Normal mood and affect. Normal behavior. Normal judgment and thought content.   Assessment and Plan:  Pregnancy: [id number] at [redacted]w[redacted]d  1. Supervision  of high risk pregnancy in first trimester Routine labs today. eRx promethazine for N/V. - Glucose, 1 hour gestational - RPR - HIV  LAB - Hemoglobin, venipuncture - Tdap vaccine greater than or equal to 7yo IM - promethazine (PHENERGAN) 25 MG tablet; Take 1 tablet (25 mg total) by mouth every 6 (six) hours as needed for nausea or vomiting.  Dispense: 30 tablet; Refill: 2  2. History of cesarean delivery Desires TOLAC. Referred to [facility] for delivery plans.  3. History of placental abruption Praised ongoing abstinence from cocaine; no abruption sx.  4. Tooth pain Not current.  5. Grand multipara   6. Substance use Plan of Safe Care info given today.  7. Positive urine drug screen 12/31/18 MJ, 03/18/19 MJ See above.   Preterm labor symptoms and general obstetric precautions including but not limited to vaginal bleeding, contractions, leaking of fluid and fetal movement were reviewed in detail with the patient. Please refer to After Visit Summary for other counseling recommendations.  Return in about 2 weeks (around 06/12/2019) for Routine prenatal care.  Future Appointments  Date Time Provider Department Center  06/12/2019  3:40 PM [facility] PROVIDER [facility] None    Landry Dyke, PA-C

## 2019-05-29 NOTE — Progress Notes (Signed)
In for visit; reports n/v x ~3 wks. ; unable to tolerate PNV, plans to try gummy vitamins; denies hospital visits; informed will be called with [facility] delivery plans appt; Peggyann Juba, RPR, HIV today; agrees to Tdap today Sharlette Dense, RN

## 2019-05-30 ENCOUNTER — Telehealth: Payer: Self-pay | Admitting: Obstetrics & Gynecology

## 2019-05-30 LAB — GLUCOSE, 1 HOUR GESTATIONAL: Gestational Diabetes Screen: 102 mg/dL (ref 65–139)

## 2019-05-30 LAB — RPR: RPR Ser Ql: NONREACTIVE

## 2019-05-30 NOTE — Telephone Encounter (Signed)
[facility] referring for Delivery plans desires TOLAC. Called and left voicemail for patient to call back to be scheduled.

## 2019-06-03 ENCOUNTER — Other Ambulatory Visit: Payer: Self-pay

## 2019-06-03 ENCOUNTER — Encounter: Payer: Self-pay | Admitting: Obstetrics and Gynecology

## 2019-06-03 ENCOUNTER — Ambulatory Visit (INDEPENDENT_AMBULATORY_CARE_PROVIDER_SITE_OTHER): Payer: Medicaid Other | Admitting: Obstetrics and Gynecology

## 2019-06-03 VITALS — BP 118/74 | Ht 62.0 in | Wt 159.0 lb

## 2019-06-03 DIAGNOSIS — O34219 Maternal care for unspecified type scar from previous cesarean delivery: Secondary | ICD-10-CM

## 2019-06-03 DIAGNOSIS — Z98891 History of uterine scar from previous surgery: Secondary | ICD-10-CM

## 2019-06-03 DIAGNOSIS — O09293 Supervision of pregnancy with other poor reproductive or obstetric history, third trimester: Secondary | ICD-10-CM

## 2019-06-03 DIAGNOSIS — Z3A33 33 weeks gestation of pregnancy: Secondary | ICD-10-CM | POA: Diagnosis not present

## 2019-06-03 DIAGNOSIS — O0993 Supervision of high risk pregnancy, unspecified, third trimester: Secondary | ICD-10-CM

## 2019-06-03 DIAGNOSIS — Z8759 Personal history of other complications of pregnancy, childbirth and the puerperium: Secondary | ICD-10-CM

## 2019-06-03 NOTE — Progress Notes (Signed)
Obstetrics & Gynecology Office Visit   Chief Complaint: Referral from [facility] for delivery planning.  History of Present Illness: 29 y.o. [id number] female at [redacted]w[redacted]d who presents to discuss delivery planning.  She has a history of one cesarean delivery and four vaginal deliveries. Her pregnancy is dated by her LMP consistent with a 12 week ultrasound.  Her pregnancy is complicated by early indicated of marijuana use. She had a negative NIPT (diploid XY). She notes +FM, no LOF, no vaginal bleeding. She denies contractions. She had a c-section with her 5th child due to heavy vaginal bleeding and concern for placental abruption. She has a posterior placenta with no evidence of previa, per report from [facility] ultrasound.     Past Medical History:  Diagnosis Date  . Anemia    past pregnancy  . Asthma   . Vaginal Pap smear, abnormal    LSIL 2017    Past Surgical History:  Procedure Laterality Date  . CESAREAN SECTION N/A 08/12/2017   Procedure: CESAREAN SECTION;  Surgeon: Natale Milch, MD;  Location: [facility] ORS;  Service: Obstetrics;  Laterality: N/A;    Gynecologic History: Patient's last menstrual period was 10/15/2018 (exact date).  Obstetric History: [id number]  Family History  Problem Relation Age of Onset  . Depression Mother   . Schizophrenia Mother   . Bipolar disorder Mother   . Diabetes Mother   . Breast cancer Mother   . Liver disease Father   . Alcohol abuse Father   . Hypertension Father   . Diabetes Maternal Grandmother     Social History   Socioeconomic History  . Marital status: Single    Spouse name: Selena Batten  . Number of children: 5  . Years of education: 10  . Highest education level: Not on file  Occupational History  . Not on file  Tobacco Use  . Smoking status: Former Games developer  . Smokeless tobacco: Never Used  Substance and Sexual Activity  . Alcohol use: Not Currently  . Drug use: Not Currently    Types: Marijuana, Cocaine  . Sexual activity: Yes    Other Topics Concern  . Not on file  Social History Narrative  . Not on file   Social Determinants of Health   Financial Resource Strain:   . Difficulty of Paying Living Expenses:   Food Insecurity: No Food Insecurity  . Worried About Programme researcher, broadcasting/film/video in the Last Year: Never true  . Ran Out of Food in the Last Year: Never true  Transportation Needs: No Transportation Needs  . Lack of Transportation (Medical): No  . Lack of Transportation (Non-Medical): No  Physical Activity:   . Days of Exercise per Week:   . Minutes of Exercise per Session:   Stress:   . Feeling of Stress :   Social Connections:   . Frequency of Communication with Friends and Family:   . Frequency of Social Gatherings with Friends and Family:   . Attends Religious Services:   . Active Member of Clubs or Organizations:   . Attends Banker Meetings:   Marland Kitchen Marital Status:   Intimate Partner Violence:   . Fear of Current or Ex-Partner:   . Emotionally Abused:   Marland Kitchen Physically Abused:   . Sexually Abused:     No Known Allergies  Prior to Admission medications   Medication Sig Start Date End Date Taking? Authorizing Provider  Prenatal Vit-Fe Fumarate-FA (PRENATAL MULTIVITAMIN) TABS tablet Take 1 tablet by mouth daily at 12 noon.  Yes [provider]  promethazine (PHENERGAN) 25 MG tablet Take 1 tablet (25 mg total) by mouth every 6 (six) hours as needed for nausea or vomiting. Patient not taking: Reported on 06/03/2019 05/29/19   Lora Havens, PA-C    Review of Systems  Constitutional: Negative.   HENT: Negative.   Eyes: Negative.   Respiratory: Negative.   Cardiovascular: Negative.   Gastrointestinal: Negative.   Genitourinary: Negative.   Musculoskeletal: Negative.   Skin: Negative.   Neurological: Negative.   Psychiatric/Behavioral: Negative.      Physical Exam BP 118/74   Ht 5\' 2"  (1.575 m)   Wt 159 lb (72.1 kg)   LMP 10/15/2018 (Exact Date)   BMI 29.08 kg/m   Patient's last menstrual period was 10/15/2018 (exact date). Physical Exam Constitutional:      General: She is not in acute distress.    Appearance: Normal appearance. She is well-developed.  HENT:     Head: Normocephalic and atraumatic.  Eyes:     General: No scleral icterus.    Conjunctiva/sclera: Conjunctivae normal.  Cardiovascular:     Rate and Rhythm: Normal rate and regular rhythm.     Heart sounds: No murmur. No friction rub. No gallop.   Pulmonary:     Effort: Pulmonary effort is normal. No respiratory distress.     Breath sounds: Normal breath sounds. No wheezing or rales.  Abdominal:     General: Bowel sounds are normal. There is no distension.     Palpations: Abdomen is soft.     Tenderness: There is no abdominal tenderness. There is no guarding or rebound.     Comments: Gravid, NT Fundal height 32 cm  Musculoskeletal:        General: Normal range of motion.     Cervical back: Normal range of motion and neck supple.  Neurological:     General: No focal deficit present.     Mental Status: She is alert and oriented to person, place, and time.     Cranial Nerves: No cranial nerve deficit.  Skin:    General: Skin is warm and dry.     Findings: No erythema.  Psychiatric:        Mood and Affect: Mood normal.        Behavior: Behavior normal.        Judgment: Judgment normal.   FHR: 130 bpm  Female chaperone present for pelvic and breast  portions of the physical exam  Assessment: 29 y.o. [id number] female here for  1. Supervision of high risk pregnancy in third trimester   2. History of cesarean delivery--desires TOLAC   3. History of placental abruption   4. [redacted] weeks gestation of pregnancy      Plan: Problem List Items Addressed This Visit      Other   History of cesarean delivery--desires TOLAC   History of placental abruption   Supervision of high risk pregnancy in first trimester - Primary    Other Visit Diagnoses    [redacted] weeks gestation of pregnancy          29 y.o. [name] at [redacted]w[redacted]d with Estimated Date of Delivery: 07/22/19 was seen today in office to discuss trial of labor after cesarean section (TOLAC) versus elective repeat cesarean delivery (ERCD). The following risks were discussed with the patient.  Risk of uterine rupture at term is 0.78 percent with TOLAC and 0.22 percent with ERCD. 1 in 10 uterine ruptures will result in neonatal death or neurological injury. The  benefits of a trial of labor after cesarean (TOLAC) resulting in a vaginal birth after cesarean (VBAC) include the following: shorter length of hospital stay and postpartum recovery (in most cases); fewer complications, such as postpartum fever, wound or uterine infection, thromboembolism (blood clots in the leg or lung), need for blood transfusion and fewer neonatal breathing problems. The risks of an attempted VBAC or TOLAC include the following: Risk of failed trial of labor after cesarean (TOLAC) without a vaginal birth after cesarean (VBAC) resulting in repeat cesarean delivery (RCD) in about 20 to 40 percent of women who attempt VBAC.  Her individualized success rate using the MFMU VBAC risk calculator is 75%.   Risk of rupture of uterus resulting in an emergency cesarean delivery. The risk of uterine rupture may be related in part to the type of uterine incision made during the first cesarean delivery. A previous transverse uterine incision has the lowest risk of rupture (0.2 to 1.5 percent risk). Vertical or T-shaped uterine incisions have a higher risk of uterine rupture (4 to 9 percent risk)The risk of fetal death is very low with both VBAC and elective repeat cesarean delivery (ERCD), but the likelihood of fetal death is higher with VBAC than with ERCD. Maternal death is very rare with either type of delivery. The risks of an elective repeat cesarean delivery (ERCD) were reviewed with the patient including but not limited to: 03/998 risk of uterine rupture which could have  serious consequences, bleeding which may require transfusion; infection which may require antibiotics; injury to bowel, bladder or other surrounding organs (bowel, bladder, ureters); injury to the fetus; need for additional procedures including hysterectomy in the event of a life-threatening hemorrhage; thromboembolic phenomenon; abnormal placentation; incisional problems; death and other postoperative or anesthesia complications.    In addition we discussed that our collective office practice is to allow patient's who desire to attempt TOLAC to go into labor naturally.  There is some limited data that rupture rate may increase past [redacted] weeks gestation, but it is reasonable for women who are strongly committed to [facility] to continue pregnancy into the 41st week.  Medical indications necessetating early delivery may arise during the course of any pregnancy.  Given the contraindication on the use of prostaglandins for use in cervical ripening,  recommendation would be to proceed with repeat cesarean for delivery for patient's with unfavorable cervix (low Bishops score) who reach 41 weeks or who otherwise have a medical indication for early delivery.   These risks and benefits are summarized on the consent form, which was reviewed with the patient during the visit.  All her questions answered and she signed a consent indicating a preference for TOLAC/ERCD. A copy of the consent was given to the patient.  At the end of the appointment today, she would like time to consider.    A total of 30 minutes were spent face-to-face with the patient as well as preparation, review, communication, and documentation during this encounter.    Thomasene Mohair, MD 06/03/2019 2:02 PM     CC:  Department, [facility] [address] [place],  [place] [address]

## 2019-06-12 ENCOUNTER — Ambulatory Visit: Payer: Medicaid Other

## 2019-06-13 ENCOUNTER — Telehealth: Payer: Self-pay | Admitting: Family Medicine

## 2019-06-13 NOTE — Telephone Encounter (Signed)
Missed apt, called no answer. LVM.

## 2019-06-25 ENCOUNTER — Ambulatory Visit: Payer: Medicaid Other

## 2019-06-27 ENCOUNTER — Ambulatory Visit: Payer: Medicaid Other | Admitting: Family Medicine

## 2019-07-02 ENCOUNTER — Telehealth: Payer: Self-pay | Admitting: Family Medicine

## 2019-07-02 NOTE — Telephone Encounter (Signed)
missed apt, no answer . LVM

## 2019-07-04 NOTE — Telephone Encounter (Signed)
Attempted to reschedule missed appt; left voicemail message informing needs tests prior to delivery & left # to call for appt Sharlette Dense, RN

## 2019-07-08 ENCOUNTER — Encounter: Payer: Self-pay | Admitting: Obstetrics & Gynecology

## 2019-07-08 ENCOUNTER — Other Ambulatory Visit: Payer: Self-pay

## 2019-07-08 ENCOUNTER — Inpatient Hospital Stay
Admission: EM | Admit: 2019-07-08 | Discharge: 2019-07-09 | DRG: 806 | Disposition: A | Discharge status: Home / Self Care | Payer: Medicaid Other | Attending: Obstetrics & Gynecology | Admitting: Obstetrics & Gynecology

## 2019-07-08 DIAGNOSIS — O99324 Drug use complicating childbirth: Secondary | ICD-10-CM | POA: Diagnosis present

## 2019-07-08 DIAGNOSIS — Z20822 Contact with and (suspected) exposure to covid-19: Secondary | ICD-10-CM | POA: Diagnosis present

## 2019-07-08 DIAGNOSIS — Z98891 History of uterine scar from previous surgery: Secondary | ICD-10-CM

## 2019-07-08 DIAGNOSIS — O09293 Supervision of pregnancy with other poor reproductive or obstetric history, third trimester: Secondary | ICD-10-CM

## 2019-07-08 DIAGNOSIS — O99323 Drug use complicating pregnancy, third trimester: Secondary | ICD-10-CM | POA: Diagnosis not present

## 2019-07-08 DIAGNOSIS — O0933 Supervision of pregnancy with insufficient antenatal care, third trimester: Secondary | ICD-10-CM

## 2019-07-08 DIAGNOSIS — K0889 Other specified disorders of teeth and supporting structures: Secondary | ICD-10-CM

## 2019-07-08 DIAGNOSIS — Z87891 Personal history of nicotine dependence: Secondary | ICD-10-CM

## 2019-07-08 DIAGNOSIS — Z8759 Personal history of other complications of pregnancy, childbirth and the puerperium: Secondary | ICD-10-CM

## 2019-07-08 DIAGNOSIS — Z641 Problems related to multiparity: Secondary | ICD-10-CM

## 2019-07-08 DIAGNOSIS — O26893 Other specified pregnancy related conditions, third trimester: Secondary | ICD-10-CM | POA: Diagnosis present

## 2019-07-08 DIAGNOSIS — O34219 Maternal care for unspecified type scar from previous cesarean delivery: Principal | ICD-10-CM | POA: Diagnosis not present

## 2019-07-08 DIAGNOSIS — Z3A38 38 weeks gestation of pregnancy: Secondary | ICD-10-CM

## 2019-07-08 DIAGNOSIS — F1290 Cannabis use, unspecified, uncomplicated: Secondary | ICD-10-CM | POA: Diagnosis present

## 2019-07-08 LAB — CBC
HCT: 37.5 % (ref 36.0–46.0)
Hemoglobin: 12.8 g/dL (ref 12.0–15.0)
MCH: 30.5 pg (ref 26.0–34.0)
MCHC: 34.1 g/dL (ref 30.0–36.0)
MCV: 89.5 fL (ref 80.0–100.0)
Platelets: 165 10*3/uL (ref 150–400)
RBC: 4.19 MIL/uL (ref 3.87–5.11)
RDW: 13.6 % (ref 11.5–15.5)
WBC: 6.1 10*3/uL (ref 4.0–10.5)
nRBC: 0 % (ref 0.0–0.2)

## 2019-07-08 LAB — TYPE AND SCREEN
ABO/RH(D): O POS
Antibody Screen: NEGATIVE

## 2019-07-08 LAB — GROUP B STREP BY PCR: Group B strep by PCR: NEGATIVE

## 2019-07-08 LAB — URINE DRUG SCREEN, QUALITATIVE (ARMC ONLY)
Amphetamines, Ur Screen: NOT DETECTED
Barbiturates, Ur Screen: NOT DETECTED
Benzodiazepine, Ur Scrn: NOT DETECTED
Cannabinoid 50 Ng, Ur ~~LOC~~: NOT DETECTED
Cocaine Metabolite,Ur ~~LOC~~: NOT DETECTED
MDMA (Ecstasy)Ur Screen: NOT DETECTED
Methadone Scn, Ur: NOT DETECTED
Opiate, Ur Screen: NOT DETECTED
Phencyclidine (PCP) Ur S: NOT DETECTED
Tricyclic, Ur Screen: NOT DETECTED

## 2019-07-08 LAB — CHLAMYDIA/NGC RT PCR (ARMC ONLY)
Chlamydia Tr: NOT DETECTED
N gonorrhoeae: NOT DETECTED

## 2019-07-08 LAB — SARS CORONAVIRUS 2 BY RT PCR (HOSPITAL ORDER, PERFORMED IN ~~LOC~~ HOSPITAL LAB): SARS Coronavirus 2: NEGATIVE

## 2019-07-08 LAB — OB RESULTS CONSOLE GC/CHLAMYDIA
Chlamydia: NEGATIVE
Gonorrhea: NEGATIVE

## 2019-07-08 MED ORDER — ONDANSETRON HCL 4 MG PO TABS: 4.0000 mg | ORAL_TABLET | ORAL | Status: DC | PRN

## 2019-07-08 MED ORDER — BUTORPHANOL TARTRATE 1 MG/ML IJ SOLN
INTRAMUSCULAR | Status: AC
  Administered 2019-07-08: 1 mg via INTRAVENOUS
  Filled 2019-07-08: qty 1

## 2019-07-08 MED ORDER — COCONUT OIL OIL: 1.0000 "application " | TOPICAL_OIL | Status: DC | PRN

## 2019-07-08 MED ORDER — BUTORPHANOL TARTRATE 1 MG/ML IJ SOLN
1.0000 mg | INTRAMUSCULAR | Status: DC | PRN
  Administered 2019-07-08: 1 mg via INTRAVENOUS
  Filled 2019-07-08: qty 1

## 2019-07-08 MED ORDER — PHENYLEPHRINE 40 MCG/ML (10ML) SYRINGE FOR IV PUSH (FOR BLOOD PRESSURE SUPPORT)
80.0000 ug | PREFILLED_SYRINGE | INTRAVENOUS | Status: DC | PRN
  Filled 2019-07-08: qty 10

## 2019-07-08 MED ORDER — ACETAMINOPHEN 325 MG PO TABS: 650.0000 mg | ORAL_TABLET | ORAL | Status: DC | PRN

## 2019-07-08 MED ORDER — SODIUM CHLORIDE 0.9 % IV SOLN: 5.0000 10*6.[IU] | Freq: Once | INTRAVENOUS | Status: DC

## 2019-07-08 MED ORDER — OXYTOCIN 10 UNIT/ML IJ SOLN
INTRAMUSCULAR | Status: AC
  Filled 2019-07-08: qty 2

## 2019-07-08 MED ORDER — ONDANSETRON HCL 4 MG/2ML IJ SOLN: 4.0000 mg | Freq: Four times a day (QID) | INTRAMUSCULAR | Status: DC | PRN

## 2019-07-08 MED ORDER — SIMETHICONE 80 MG PO CHEW: 80.0000 mg | CHEWABLE_TABLET | ORAL | Status: DC | PRN

## 2019-07-08 MED ORDER — ACETAMINOPHEN 325 MG PO TABS
650.0000 mg | ORAL_TABLET | ORAL | Status: DC | PRN
  Administered 2019-07-08 – 2019-07-09 (×4): 650 mg via ORAL
  Filled 2019-07-08 (×4): qty 2

## 2019-07-08 MED ORDER — IBUPROFEN 600 MG PO TABS
600.0000 mg | ORAL_TABLET | Freq: Four times a day (QID) | ORAL | Status: DC
  Administered 2019-07-08 – 2019-07-09 (×5): 600 mg via ORAL
  Filled 2019-07-08 (×5): qty 1

## 2019-07-08 MED ORDER — DIPHENHYDRAMINE HCL 50 MG/ML IJ SOLN: 12.5000 mg | INTRAMUSCULAR | Status: DC | PRN

## 2019-07-08 MED ORDER — EPHEDRINE 5 MG/ML INJ
10.0000 mg | INTRAVENOUS | Status: DC | PRN
  Filled 2019-07-08: qty 2

## 2019-07-08 MED ORDER — AMMONIA AROMATIC IN INHA
RESPIRATORY_TRACT | Status: AC
  Filled 2019-07-08: qty 10

## 2019-07-08 MED ORDER — FENTANYL 2.5 MCG/ML W/ROPIVACAINE 0.15% IN NS 100 ML EPIDURAL (ARMC): 12.0000 mL/h | EPIDURAL | Status: DC

## 2019-07-08 MED ORDER — OXYTOCIN BOLUS FROM INFUSION
500.0000 mL | Freq: Once | INTRAVENOUS | Status: AC
  Administered 2019-07-08: 500 mL via INTRAVENOUS

## 2019-07-08 MED ORDER — BENZOCAINE-MENTHOL 20-0.5 % EX AERO
1.0000 "application " | INHALATION_SPRAY | CUTANEOUS | Status: DC | PRN
  Administered 2019-07-08: 1 via TOPICAL
  Filled 2019-07-08: qty 56

## 2019-07-08 MED ORDER — LACTATED RINGERS IV SOLN: 500.0000 mL | Freq: Once | INTRAVENOUS | Status: DC

## 2019-07-08 MED ORDER — OXYTOCIN 40 UNITS IN NORMAL SALINE INFUSION - SIMPLE MED
2.5000 [IU]/h | INTRAVENOUS | Status: DC
  Filled 2019-07-08: qty 1000

## 2019-07-08 MED ORDER — LACTATED RINGERS IV SOLN: INTRAVENOUS | Status: DC

## 2019-07-08 MED ORDER — MISOPROSTOL 200 MCG PO TABS
ORAL_TABLET | ORAL | Status: AC
  Filled 2019-07-08: qty 4

## 2019-07-08 MED ORDER — ONDANSETRON HCL 4 MG/2ML IJ SOLN: 4.0000 mg | INTRAMUSCULAR | Status: DC | PRN

## 2019-07-08 MED ORDER — LIDOCAINE HCL (PF) 1 % IJ SOLN
30.0000 mL | INTRAMUSCULAR | Status: DC | PRN
  Filled 2019-07-08: qty 30

## 2019-07-08 MED ORDER — PRENATAL MULTIVITAMIN CH
1.0000 | ORAL_TABLET | Freq: Every day | ORAL | Status: DC
  Administered 2019-07-08 – 2019-07-09 (×2): 1 via ORAL
  Filled 2019-07-08 (×2): qty 1

## 2019-07-08 MED ORDER — FENTANYL 2.5 MCG/ML W/ROPIVACAINE 0.15% IN NS 100 ML EPIDURAL (ARMC)
EPIDURAL | Status: AC
  Filled 2019-07-08: qty 100

## 2019-07-08 MED ORDER — SENNOSIDES-DOCUSATE SODIUM 8.6-50 MG PO TABS: 2.0000 | ORAL_TABLET | ORAL | Status: DC

## 2019-07-08 MED ORDER — WITCH HAZEL-GLYCERIN EX PADS: 1.0000 "application " | MEDICATED_PAD | CUTANEOUS | Status: DC | PRN

## 2019-07-08 MED ORDER — DIBUCAINE (PERIANAL) 1 % EX OINT: 1.0000 "application " | TOPICAL_OINTMENT | CUTANEOUS | Status: DC | PRN

## 2019-07-08 MED ORDER — LACTATED RINGERS IV SOLN: 500.0000 mL | INTRAVENOUS | Status: DC | PRN

## 2019-07-08 MED ORDER — DIPHENHYDRAMINE HCL 25 MG PO CAPS: 25.0000 mg | ORAL_CAPSULE | Freq: Four times a day (QID) | ORAL | Status: DC | PRN

## 2019-07-08 MED ORDER — ZOLPIDEM TARTRATE 5 MG PO TABS: 5.0000 mg | ORAL_TABLET | Freq: Every evening | ORAL | Status: DC | PRN

## 2019-07-08 MED ORDER — PENICILLIN G 3 MILLION UNITS IVPB - SIMPLE MED
3.0000 10*6.[IU] | INTRAVENOUS | Status: DC
  Filled 2019-07-08 (×4): qty 100

## 2019-07-08 NOTE — Progress Notes (Signed)
   07/08/19 1315  Clinical Encounter Type  Visited With Patient  Visit Type Initial  Referral From Chaplain  Consult/Referral To Chaplain  While rounding chaplain visited briefly with patient, who delivered baby boy this morning. Chaplain offered pastoral presence, empathy, and prayer.

## 2019-07-08 NOTE — Discharge Summary (Signed)
OB Discharge Summary     Patient Name: Carolyn Petersen DOB: 1990/04/24 MRN: [id number]  Date of admission: 07/08/2019 Delivering MD: Imagene Riches   Date of discharge: 07/09/2019  Admitting diagnosis: onset of labor Intrauterine pregnancy: [redacted]w[redacted]d     Secondary diagnosis:  Prior Cesarean section-desires trial of labor Additional problems: none     Discharge diagnosis: Term Pregnancy Delivered ; Vaginal birth after Cesarean section                                                                                           Post partum procedures:none  Augmentation: N/A  Complications: None  Hospital course:  Onset of Labor With Vaginal Delivery      29 y.o. yo [id number] at [redacted]w[redacted]d was admitted in Active Labor on 07/08/2019. Patient had an uncomplicated labor course and had a successful vaginal delivery after prior Cesarean section  Membrane Rupture Time/Date: 6:17 AM ,07/08/2019   Delivery Method:Vaginal, Spontaneous  Episiotomy: None  Lacerations:  1st degree;Labial  Patient had an uncomplicated postpartum course.  She is ambulating, tolerating a regular diet,  and urinating well. Patient is discharged home in stable condition on 07/09/19.  Newborn Data: Birth date:07/08/2019  Birth time:8:14 AM  Gender:Female Legend.  Living status:Living  Apgars:8 ,9  Weight:2760 g   Physical exam  Vitals:   07/08/19 1235 07/08/19 1630 07/08/19 2333 07/09/19 0755  BP: 118/69 102/66 105/67 121/64  Pulse: 67 65 67 76  Resp: 17 20 18 18   Temp: 97.6 F (36.4 C) 98.6 F (37 C) 98.1 F (36.7 C) 98 F (36.7 C)  TempSrc: Oral Axillary Oral Oral  SpO2: 100% 100% 100% 100%  Weight:      Height:       General: alert, cooperative and no distress Lochia: appropriate Uterine Fundus: firm/ U-1/ML/NT Incision: N/A DVT Evaluation: No evidence of DVT seen on physical exam. Negative Homan's sign. No cords or calf tenderness. Labs: Lab Results  Component Value Date   WBC 8.8 07/09/2019   HGB 11.8 (L) 07/09/2019   HCT 34.4 (L) 07/09/2019   MCV 90.5 07/09/2019   PLT 178 07/09/2019   CMP Latest Ref Rng & Units 07/31/2017  Glucose 65 - 99 mg/dL 79  BUN 6 - 20 mg/dL 6  Creatinine 0.44 - 1.00 mg/dL 0.53  Sodium 135 - 145 mmol/L 134(L)  Potassium 3.5 - 5.1 mmol/L 3.7  Chloride 101 - 111 mmol/L 106  CO2 22 - 32 mmol/L 21(L)  Calcium 8.9 - 10.3 mg/dL 8.2(L)  Total Protein 6.5 - 8.1 g/dL 6.8  Total Bilirubin 0.3 - 1.2 mg/dL 0.5  Alkaline Phos 38 - 126 U/L 89  AST 15 - 41 U/L 20  ALT 14 - 54 U/L 10(L)    Discharge instruction: per After Visit Summary and "Baby and Me Booklet".  After visit meds:  Allergies as of 07/09/2019   No Known Allergies     Medication List    STOP taking these medications   promethazine 25 MG tablet Commonly known as: PHENERGAN     TAKE these medications   acetaminophen 325 MG tablet Commonly known as: TYLENOL  Take 2 tablets (650 mg total) by mouth every 4 (four) hours as needed for mild pain or moderate pain (for pain scale < 4ORtemperature>/=100.5 F).   ibuprofen 600 MG tablet Commonly known as: ADVIL Take 1 tablet (600 mg total) by mouth every 6 (six) hours.   prenatal multivitamin Tabs tablet Take 1 tablet by mouth daily at 12 noon.       Diet: routine diet  Activity: Advance as tolerated. Pelvic rest for 6 weeks.   Outpatient follow up:6 weeks Follow up Appt: Future Appointments  Date Time Provider Department Center  07/11/2019  8:40 AM [facility] PROVIDER [facility] None   Follow up Visit: Make appointment for 1-2 weeks for Nexplanon insertion Postpartum contraception: Nexplanon   Newborn Delivery   Birth date/time: 07/08/2019 08:14:00 Delivery type: Vaginal, Spontaneous      Baby Feeding: Bottle Disposition:home with mother   07/09/2019 Farrel Conners, CNM

## 2019-07-08 NOTE — OB Triage Note (Signed)
Pt G7P5 38wks0d presents to birthplace from ED via EMS. Complains of frequent contractions. Reports no LOF, discharge or vag bleeding. Reports positive fetal movement. Monitors applied and assessing. FHT 150 at 05:56.

## 2019-07-08 NOTE — H&P (Signed)
OB History & Physical   History of Present Illness:  Chief Complaint: contractions  HPI:  Carolyn Petersen is a 29 y.o. [id number] female at [redacted]w[redacted]d dated by LMP.  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    She reports contractions since about 3 am that have gotten closer and stronger.   She reports leakage of fluid- green- while in triage.   She denies vaginal bleeding.   She reports fetal movement.    Total weight gain for pregnancy: 4.96 kg   Obstetrical Problem List: Pregnant Problems (from 12/30/18 to present)    Problem Noted Resolved   History of cesarean delivery--desires TOLAC 12/31/2018 by Charlotte Sanes L, PA-C No   Overview Addendum 03/18/2019 12:00 PM by Herbie Saxon, CNM    07/2017 d/t placental abruption Needs delivery plans appt 34-36 wks with [facility]      History of placental abruption 12/31/2018 by Charlotte Sanes L, PA-C No   Overview Addendum 12/31/2018  4:15 PM by Kandee Keen, PA-C    07/2017 in setting of marijuana and cocaine use      Tooth pain 12/31/2018 by Charlotte Sanes L, PA-C No   Overview Signed 12/31/2018  2:30 PM by Kandee Keen, PA-C    Took percocet during early preg, rx from [place] multipara 12/31/2018 by Kandee Keen, PA-C No   Overview Signed 12/31/2018  4:57 PM by Kandee Keen, PA-C    [phone]          Maternal Medical History:   Past Medical History:  Diagnosis Date  . Anemia    past pregnancy  . Asthma   . Vaginal Pap smear, abnormal    LSIL 2017    Past Surgical History:  Procedure Laterality Date  . CESAREAN SECTION N/A 08/12/2017   Procedure: CESAREAN SECTION;  Surgeon: Homero Fellers, MD;  Location: [facility] ORS;  Service: Obstetrics;  Laterality: N/A;    No Known Allergies  Prior to Admission medications   Medication Sig  Start Date End Date Taking? Authorizing Provider  Prenatal Vit-Fe Fumarate-FA (PRENATAL MULTIVITAMIN) TABS tablet Take 1 tablet by mouth daily at 12 noon.   Yes [provider]  promethazine (PHENERGAN) 25 MG tablet Take 1 tablet (25 mg total) by mouth every 6 (six) hours as needed for nausea or vomiting. Patient not taking: Reported on 06/03/2019 05/29/19   Lora Havens, PA-C    OB History  Gravida Para Term Preterm AB Living  7 5 5  0 1 5  SAB TAB Ectopic Multiple Live Births  0 1   0 5    # Outcome Date GA Lbr Len/2nd Weight Sex Delivery Anes PTL Lv  7 Current           6 TAB 03/2018          5 Term 08/12/17 [redacted]w[redacted]d  2510 g M CS-LTranv Gen  LIV  4 Term 12/01/15 [redacted]w[redacted]d 10:24 / 00:07 2770 g M Vag-Spont EPI  LIV  3 Term 02/28/14 [redacted]w[redacted]d  2940 g M Vag-Spont EPI  LIV  2 Term 09/03/12 [redacted]w[redacted]d  2892 g F Vag-Spont EPI  LIV  1 Term 01/31/10 [redacted]w[redacted]d  2495 g F Vag-Spont EPI  LIV     Birth Comments:       Complications: IUGR (intrauterine growth restriction) affecting care of mother  [facility]  Social History: She  reports that she has quit smoking. She has never used smokeless tobacco. She reports previous alcohol use. She reports previous drug use. Drugs: Marijuana and Cocaine.  Family History: family history includes Alcohol abuse in her father; Bipolar disorder in her mother; Breast cancer in her mother; Depression in her mother; Diabetes in her maternal grandmother and mother; Hypertension in her father; Liver disease in her father; Schizophrenia in her mother.    Review of Systems:  Review of Systems  Constitutional: Negative for chills and fever.  HENT: Negative for congestion, ear discharge, ear pain, hearing loss, sinus pain and sore throat.   Eyes: Negative for blurred vision and double vision.  Respiratory: Negative for cough, shortness of breath and wheezing.   Cardiovascular: Negative for chest pain, palpitations and  leg swelling.  Gastrointestinal: Positive for abdominal pain. Negative for blood in stool, constipation, diarrhea, heartburn, melena, nausea and vomiting.  Genitourinary: Negative for dysuria, flank pain, frequency, hematuria and urgency.  Musculoskeletal: Negative for back pain, joint pain and myalgias.  Skin: Negative for itching and rash.  Neurological: Negative for dizziness, tingling, tremors, sensory change, speech change, focal weakness, seizures, loss of consciousness, weakness and headaches.  Endo/Heme/Allergies: Negative for environmental allergies. Does not bruise/bleed easily.  Psychiatric/Behavioral: Negative for depression, hallucinations, memory loss, substance abuse and suicidal ideas. The patient is not nervous/anxious and does not have insomnia.      Physical Exam:  BP 126/73 (BP Location: Left Arm)   Pulse 95   Temp 98.5 F (36.9 C) (Oral)   Resp 16   Ht 5\' 2"  (1.575 m)   Wt 73 kg   LMP 10/15/2018 (Exact Date)   BMI 29.44 kg/m   Constitutional: Well nourished, well developed female in no acute distress.  HEENT: normal Skin: Warm and dry.  Cardiovascular: Regular rate and rhythm.   Extremity: no edema  Respiratory: Clear to auscultation bilateral. Normal respiratory effort Abdomen: FHT present Back: no CVAT Neuro: DTRs 2+, Cranial nerves grossly intact Psych: Alert and Oriented x3. No memory deficits. Normal mood and affect.  MS: normal gait, normal bilateral lower extremity ROM/strength/stability.  Pelvic exam:  4-5/70/-2   Pertinent Results:  Prenatal Labs Blood type/Rh O positive  Antibody screen negative  Rubella Immune  Varicella Immune    RPR Non-reactive  HBsAg negative  HIV negative  GC negative  Chlamydia negative  Genetic screening Normal female  1 hour GTT 102  3 hour GTT NA  GBS Negative on 1/24- repeat today   Baseline FHR: 135 beats/min   Variability: moderate   Accelerations: present   Decelerations: present- variable Contractions:  present frequency: every 2-4 minutes Overall assessment: reassuring  Bedside Ultrasound:    Presentation: vertex    No results found for: SARSCOV2NAA]  Results pending for covid 19 test  Assessment:  Carolyn Petersen is a 29 y.o. 26 female at [redacted]w[redacted]d with active labor, SROM meconium.   Plan:  1. Admit to Labor & Delivery  2. CBC, T&S, UDS, GC/CT, RPR, Clrs, IVF 3. GBS unknown: PCR swab done, treat with penicillin for unknown.   4. Fetal well-being: reassuring 5. VBAC protocol: MDs, incoming CNM, anesthesia and OR all aware 6. Expect vaginal delivery  [redacted]w[redacted]d, [facility] 07/08/2019 6:54 AM

## 2019-07-08 NOTE — Progress Notes (Signed)
Delivery Note At 8:14 AM a viable female was delivered via Vaginal, Spontaneous (Presentation: Left Occiput Anterior).  APGAR: 8, 9; weight  .   Placenta status: Spontaneous, Intact.  Cord: 3 vessels with the following complications: None.  Cord pH: NA  Anesthesia: None Episiotomy: None Lacerations: 1st degree;Labial Suture Repair: NA. intact Est. Blood Loss (mL):    Mom to postpartum.  Baby to Couplet care / Skin to Skin.  Mirna Mires 07/08/2019, 8:57 AM

## 2019-07-09 DIAGNOSIS — O34219 Maternal care for unspecified type scar from previous cesarean delivery: Secondary | ICD-10-CM | POA: Diagnosis not present

## 2019-07-09 LAB — CBC
HCT: 34.4 % — ABNORMAL LOW (ref 36.0–46.0)
Hemoglobin: 11.8 g/dL — ABNORMAL LOW (ref 12.0–15.0)
MCH: 31.1 pg (ref 26.0–34.0)
MCHC: 34.3 g/dL (ref 30.0–36.0)
MCV: 90.5 fL (ref 80.0–100.0)
Platelets: 178 10*3/uL (ref 150–400)
RBC: 3.8 MIL/uL — ABNORMAL LOW (ref 3.87–5.11)
RDW: 13.8 % (ref 11.5–15.5)
WBC: 8.8 10*3/uL (ref 4.0–10.5)
nRBC: 0 % (ref 0.0–0.2)

## 2019-07-09 LAB — RPR: RPR Ser Ql: NONREACTIVE

## 2019-07-09 MED ORDER — ACETAMINOPHEN 325 MG PO TABS: 650.0000 mg | ORAL_TABLET | ORAL | Status: DC | PRN

## 2019-07-09 MED ORDER — IBUPROFEN 600 MG PO TABS: 600.0000 mg | ORAL_TABLET | Freq: Four times a day (QID) | ORAL | 0 refills | Status: DC

## 2019-07-09 NOTE — Clinical Social Work Maternal (Signed)
CLINICAL SOCIAL WORK MATERNAL/CHILD NOTE  Patient Details  Name: Carolyn Petersen MRN: [id number] Date of Birth: 1990/11/22  Date:  07/09/2019  Clinical Social Worker Initiating Note:  Oleh Genin, LCSW Date/Time: Initiated:  07/09/19/      Child's Name:  Carolyn Petersen   Biological Parents:  Mother, Father   Need for Interpreter:  None   Reason for Referral:  Current Substance Use/Substance Use During Pregnancy    Address:  [address] [place] [address]    Phone number:  [phone] (home)     Additional phone number: [phone]  Household Members/Support Persons (HM/SP):   Household Member/Support Person 1, Household Member/Support Person 2, Household Member/Support Person 3, Household Member/Support Person 4, Household Member/Support Person 5   HM/SP Name Relationship DOB or Age  HM/SP -88 [place] daughter 71 years old  HM/SP -2 Deonna Krummel daughter 54 years old  HM/SP -41 Chance Petersen son 35 years old  HM/SP -[place] son 3 years ago  HM/SP -5 [place] Petersen son 60 year old  HM/SP -6        HM/SP -7        HM/SP -8          Natural Supports (not living in the home):  Spouse/significant other, Immediate Family, Friends, Extended Family   Professional Supports:     Employment: Unemployed   Type of Work:     Education:  9 to 11 years(Completed 9th grade)   Homebound arranged: No  Financial Resources:  [employer]   Other Resources:  Physicist, medical , [place] Considerations Which May Impact Care:  None reported  Strengths:  Ability to meet basic needs , Compliance with medical plan , Home prepared for child , Understanding of illness, Pediatrician chosen   Psychotropic Medications:         Pediatrician:    [employer]  Pediatrician List:   [place] ([facility])  [facility]      Pediatrician Fax Number:    Risk  Factors/Current Problems:  Substance Use    Cognitive State:  Able to Concentrate , Alert , Goal Oriented , Insightful    Mood/Affect:  Happy , Calm    CSW Assessment: CSW was consulted due to MOB having positive UDS for Marijuana during pregnancy. Baby's UDS was negative, CDS is still pending. MOB's UDS was negative at admission. CSW spoke with RN Maddy prior to meeting with MOB. Per RN, MOB did miss several prenatal appointments. No additional concerns per RN.  CSW met with MOB at bedside. MOB reported she is feeling good post delivery. MOB answered questions appropriately and was attentive to [facility] during assessment. Explained CSW role and reason for referral.   MOB reported she lives at home with herself and her other 5 children (Baby Legend is number 6). MOB reported FOB (Carolyn Petersen) and MOB's Mother are very involved and supportive. MOB denied any past or current mental health issues/treatments/resource needs. She also denied SI, HI, or DV.  CSW explained Drug Screen/CPS Report Policy and told MOB that a report would be made per policy due to MOB having positive screen during pregnancy. MOB verbalized understanding. MOB reported she used Marijuana (denied other substances) during her pregnancy due to loss of appetite. She reported this use was earlier on in her pregnancy and said she has not used Marijuana or any  other substances recently. MOB reported she has a history with [place] for the same reason (Marijuana use) with her last child ([place], 29 year old). After assessment, CSW made CPS Report to [place] Worker Chrissie Noa. Informed Andrew Au and Baby are scheduled to discharge today around lunch time.   CSW provided information sheets and education on PPD and SIDS. Encouraged MOB to reach out to her Provider with any questions or needs for additional support, even after discharge. She agreed.  MOB reported that she plans to use [facility] for [facility]  medical care. MOB reported she has a new car seat, bassinet, crib, and all other items needed for Baby. MOB reported FOB provides transportation to appointments and denied issues with getting herself or her children to appointments when needed.   CSW will monitor CDS and report any positive results per the hospital's policy.   CSW encouraged MOB to reach out with any questions or needs.   CSW Plan/Description:  Sudden Infant Death Syndrome (SIDS) Education, Perinatal Mood and Anxiety Disorder (PMADs) Education, Child Protective Service Report , CSW Will Continue to Monitor Umbilical Cord Tissue Drug Screen Results and Make Report if Warranted, CSW Awaiting CPS Disposition Plan    Magnus Ivan, LCSW 07/09/2019, 9:39 AM

## 2019-07-09 NOTE — Progress Notes (Signed)
Post Partum Day 1 Subjective: up ad lib, voiding, tolerating PO and some cramping relieved with po analgesics Would like to go home today when baby is discharged Objective: Blood pressure 121/64, pulse 76, temperature 98 F (36.7 C), temperature source Oral, resp. rate 18, height 5\' 2"  (1.575 m), weight 73 kg, last menstrual period 10/15/2018, SpO2 100 %, unknown if currently breastfeeding.  Physical Exam:  General: alert, cooperative and no distress Lochia: appropriate Uterine Fundus: firm/ U-1/ML  DVT Evaluation: No evidence of DVT seen on physical exam. No significant calf/ankle edema.  Recent Labs    07/08/19 0648 07/09/19 0516  HGB 12.8 11.8*  HCT 37.5 34.4*  WBC 6.1 8.8  PLT 165 178    Assessment/Plan: Stable PPD #1 Discharge today when baby discharged O POS/ RI/ VI Nexplanon Bottle TDAP-given AP  07/11/19, CNM    LOS: 1 day   Farrel Conners 07/09/2019, 8:47 AM

## 2019-07-09 NOTE — Progress Notes (Signed)
Pt discharged with infant. Discharge instructions, prescriptions, and follow up appointments given to and reviewed with patient. Pt verbalized understanding. To be escorted out by auxillary.  °

## 2019-07-09 NOTE — Discharge Instructions (Signed)
°Vaginal Delivery, Care After °Refer to this sheet in the next few weeks. These discharge instructions provide you with information on caring for yourself after delivery. Your caregiver may also give you specific instructions. Your treatment has been planned according to the most current medical practices available, but problems sometimes occur. Call your caregiver if you have any problems or questions after you go home. °HOME CARE INSTRUCTIONS °1. Take over-the-counter or prescription medicines only as directed by your caregiver or pharmacist. °2. Do not drink alcohol, especially if you are breastfeeding or taking medicine to relieve pain. °3. Do not smoke tobacco. °4. Continue to use good perineal care. Good perineal care includes: °1. Wiping your perineum from back to front °2. Keeping your perineum clean. °3. You can do sitz baths twice a day, to help keep this area clean °5. Do not use tampons, douche or have sex for 6 weeks °6. Shower only and avoid sitting in submerged water, aside from sitz baths °7. Wear a well-fitting bra that provides breast support. °8. Eat healthy foods. °9. Drink enough fluids to keep your urine clear or pale yellow. °10. Eat high-fiber foods such as whole grain cereals and breads, brown rice, beans, and fresh fruits and vegetables every day. These foods may help prevent or relieve constipation. °11. Avoid constipation with high fiber foods or medications, such as miralax or metamucil °12. Follow your caregiver's recommendations regarding resumption of activities such as climbing stairs, driving, lifting, exercising, or traveling. °13. Talk to your caregiver about resuming sexual activities. Resumption of sexual activities after 6 weeks is dependent upon your risk of infection, your rate of healing, and your comfort and desire to resume sexual activity. °14. Try to have someone help you with your household activities and your newborn for at least a few days after you leave the  hospital. °15. Rest as much as possible. Try to rest or take a nap when your newborn is sleeping. °16. Increase your activities gradually. °17. Keep all of your scheduled postpartum appointments. It is very important to keep your scheduled follow-up appointments. At these appointments, your caregiver will be checking to make sure that you are healing physically and emotionally. °SEEK MEDICAL CARE IF:  °· You are passing large clots from your vagina. Save any clots to show your caregiver. °· You have a foul smelling discharge from your vagina. °· You have trouble urinating. °· You are urinating frequently. °· You have pain when you urinate. °· You have a change in your bowel movements. °· You have increasing redness, pain, or swelling near your vaginal incision (episiotomy) or vaginal tear. °· You have pus draining from your episiotomy or vaginal tear. °· Your episiotomy or vaginal tear is separating. °· You have painful, hard, or reddened breasts. °· You have a severe headache. °· You have blurred vision or see spots. °· You feel sad or depressed. °· You have thoughts of hurting yourself or your newborn. °· You have questions about your care, the care of your newborn, or medicines. °· You are dizzy or light-headed. °· You have a rash. °· You have nausea or vomiting. °· You were breastfeeding and have not had a menstrual period within 12 weeks after you stopped breastfeeding. °· You are not breastfeeding and have not had a menstrual period by the 12th week after delivery. °· You have a fever of 100.5 or more °SEEK IMMEDIATE MEDICAL CARE IF:  °· You have persistent pain. °· You have chest pain. °· You have shortness   of breath. °· You faint. °· You have leg pain. °· You have stomach pain. °· Your vaginal bleeding saturates two or more sanitary pads in 1 hour. °MAKE SURE YOU:  °· Understand these instructions. °· Will watch your condition. °· Will get help right away if you are not doing well or get worse. °Document  Released: 01/28/2000 Document Revised: 06/16/2013 Document Reviewed: 09/27/2011 °ExitCare® Patient Information ©2015 [employer]. This information is not intended to replace advice given to you by your health care provider. Make sure you discuss any questions you have with your health care provider. ° °Sitz Bath °A sitz bath is a warm water bath taken in the sitting position. The water covers only the hips and butt (buttocks). We recommend using one that fits in the toilet, to help with ease of use and cleanliness. It may be used for either healing or cleaning purposes. Sitz baths are also used to relieve pain, itching, or muscle tightening (spasms). The water may contain medicine. Moist heat will help you heal and relax.  °HOME CARE  °Take 3 to 4 sitz baths a day. °18. Fill the bathtub half-full with warm water. °19. Sit in the water and open the drain a little. °20. Turn on the warm water to keep the tub half-full. Keep the water running constantly. °21. Soak in the water for 15 to 20 minutes. °22. After the sitz bath, pat the affected area dry. °GET HELP RIGHT AWAY IF: °You get worse instead of better. Stop the sitz baths if you get worse. °MAKE SURE YOU: °· Understand these instructions. °· Will watch your condition. °· Will get help right away if you are not doing well or get worse. °Document Released: 03/09/2004 Document Revised: 10/25/2011 Document Reviewed: 05/30/2010 °ExitCare® Patient Information ©2015 [employer]. This information is not intended to replace advice given to you by your health care provider. Make sure you discuss any questions you have with your health care provider. ° ° °

## 2019-07-11 ENCOUNTER — Ambulatory Visit: Payer: Medicaid Other

## 2019-08-20 ENCOUNTER — Ambulatory Visit: Payer: Medicaid Other | Admitting: Obstetrics

## 2020-02-09 ENCOUNTER — Emergency Department
Admission: EM | Admit: 2020-02-09 | Discharge: 2020-02-09 | Disposition: A | Discharge status: Home / Self Care | Payer: Medicaid Other | Attending: Emergency Medicine | Admitting: Emergency Medicine

## 2020-02-09 ENCOUNTER — Emergency Department: Payer: Medicaid Other

## 2020-02-09 ENCOUNTER — Other Ambulatory Visit: Payer: Self-pay

## 2020-02-09 DIAGNOSIS — J45909 Unspecified asthma, uncomplicated: Secondary | ICD-10-CM | POA: Insufficient documentation

## 2020-02-09 DIAGNOSIS — S99911A Unspecified injury of right ankle, initial encounter: Secondary | ICD-10-CM | POA: Diagnosis present

## 2020-02-09 DIAGNOSIS — X500XXA Overexertion from strenuous movement or load, initial encounter: Secondary | ICD-10-CM | POA: Diagnosis not present

## 2020-02-09 DIAGNOSIS — Z87891 Personal history of nicotine dependence: Secondary | ICD-10-CM | POA: Insufficient documentation

## 2020-02-09 DIAGNOSIS — S93401A Sprain of unspecified ligament of right ankle, initial encounter: Secondary | ICD-10-CM | POA: Diagnosis not present

## 2020-02-09 MED ORDER — MELOXICAM 15 MG PO TABS: 15.0000 mg | ORAL_TABLET | Freq: Every day | ORAL | 2 refills | Status: DC

## 2020-02-09 NOTE — ED Notes (Signed)
Pt has pain in right ankle.  Pt fell 2 days ago.  States it hurts to walk on it.  Pt alert

## 2020-02-09 NOTE — ED Provider Notes (Signed)
[facility]-EMERGENCY DEPARTMENT  ____________________________________________  Time seen: Approximately 8:09 PM  I have reviewed the triage vital signs and the nursing notes.   HISTORY  Chief Complaint Ankle Pain   Historian Patient    HPI Deshanna Kama is a 29 y.o. female presents to the emergency department with right ankle pain.  Patient sustained an inversion type ankle injury 2 days ago.  No numbness or tingling in the right lower extremity.  Patient states that she has had pain with ambulation.  No prior right ankle sprains in the past.  No other alleviating measures have been attempted.   Past Medical History:  Diagnosis Date  . Anemia    past pregnancy  . Asthma   . Vaginal Pap smear, abnormal    LSIL 2017     Immunizations up to date:  Yes.     Past Medical History:  Diagnosis Date  . Anemia    past pregnancy  . Asthma   . Vaginal Pap smear, abnormal    LSIL 2017    Patient Active Problem List   Diagnosis Date Noted  . Vaginal birth after cesarean 07/09/2019  . Normal labor 07/08/2019  . Postpartum care following vaginal delivery 07/08/2019  . Positive urine drug screen 12/31/18 MJ, 03/18/19 MJ 03/18/2019  . Encounter for antenatal screening for chromosomal anomalies   . History of cesarean delivery--desires TOLAC 12/31/2018  . History of placental abruption 12/31/2018  . Tooth pain 12/31/2018  . Supervision of high risk pregnancy in first trimester 12/31/2018  . Substance use 12/31/2018  . Grand multipara 12/31/2018  . History of prior pregnancy with IUGR newborn 03/26/2017  . Overweight 12/19/2016    Past Surgical History:  Procedure Laterality Date  . CESAREAN SECTION N/A 08/12/2017   Procedure: CESAREAN SECTION;  Surgeon: Natale Milch, MD;  Location: [facility] ORS;  Service: Obstetrics;  Laterality: N/A;    Prior to Admission medications   Medication Sig Start Date End Date Taking? Authorizing Provider  meloxicam (MOBIC) 15 MG  tablet Take 1 tablet (15 mg total) by mouth daily. 02/09/20 02/08/21 Yes Pia Mau M, PA-C  acetaminophen (TYLENOL) 325 MG tablet Take 2 tablets (650 mg total) by mouth every 4 (four) hours as needed for mild pain or moderate pain (for pain scale < 4ORtemperature>/=100.5 F). 07/09/19   Farrel Conners, CNM  ibuprofen (ADVIL) 600 MG tablet Take 1 tablet (600 mg total) by mouth every 6 (six) hours. 07/09/19   Farrel Conners, CNM  Prenatal Vit-Fe Fumarate-FA (PRENATAL MULTIVITAMIN) TABS tablet Take 1 tablet by mouth daily at 12 noon.    [provider]    Allergies Patient has no known allergies.  Family History  Problem Relation Age of Onset  . Depression Mother   . Schizophrenia Mother   . Bipolar disorder Mother   . Diabetes Mother   . Breast cancer Mother   . Liver disease Father   . Alcohol abuse Father   . Hypertension Father   . Diabetes Maternal Grandmother     Social History Social History   Tobacco Use  . Smoking status: Former Games developer  . Smokeless tobacco: Never Used  Vaping Use  . Vaping Use: Never used  Substance Use Topics  . Alcohol use: Not Currently  . Drug use: Not Currently    Types: Marijuana, Cocaine     Review of Systems  Constitutional: No fever/chills Eyes:  No discharge ENT: No upper respiratory complaints. Respiratory: no cough. No SOB/ use of accessory muscles to breath  Gastrointestinal:   No nausea, no vomiting.  No diarrhea.  No constipation. Musculoskeletal: Patient has right ankle pain.  Skin: Negative for rash, abrasions, lacerations, ecchymosis.    ____________________________________________   PHYSICAL EXAM:  VITAL SIGNS: ED Triage Vitals [02/09/20 1650]  Enc Vitals Group     BP 118/66     Pulse Rate 75     Resp 15     Temp 98.7 F (37.1 C)     Temp src      SpO2 98 %     Weight 155 lb (70.3 kg)     Height 5\' 2"  (1.575 m)     Head Circumference      Peak Flow      Pain Score 6     Pain Loc       Pain Edu?      Excl. in GC?      Constitutional: Alert and oriented. Well appearing and in no acute distress. Eyes: Conjunctivae are normal. PERRL. EOMI. Head: Atraumatic. Cardiovascular: Normal rate, regular rhythm. Normal S1 and S2.  Good peripheral circulation. Respiratory: Normal respiratory effort without tachypnea or retractions. Lungs CTAB. Good air entry to the bases with no decreased or absent breath sounds Gastrointestinal: Bowel sounds x 4 quadrants. Soft and nontender to palpation. No guarding or rigidity. No distention. Musculoskeletal: Patient has full range of motion at the right ankle.  She is able to move all 5 right toes.  Palpable dorsalis pedis pulse, right.  Patient does have some tenderness to palpation over right anterior talofibular ligament. Neurologic:  Normal for age. No gross focal neurologic deficits are appreciated.  Skin:  Skin is warm, dry and intact. No rash noted. Psychiatric: Mood and affect are normal for age. Speech and behavior are normal.   ____________________________________________   LABS (all labs ordered are listed, but only abnormal results are displayed)  Labs Reviewed - No data to display ____________________________________________  EKG   ____________________________________________  RADIOLOGY , personally viewed and evaluated these images (plain radiographs) as part of my medical decision making, as well as reviewing the written report by the radiologist.  DG Ankle Complete Right  Result Date: 02/09/2020 CLINICAL DATA:  Right ankle pain and swelling after fall 2 days ago. EXAM: RIGHT ANKLE - COMPLETE 3+ VIEW COMPARISON:  None. FINDINGS: There is no evidence of fracture, dislocation, or joint effusion. Ankle mortise is intact. Base of the fifth metatarsal is intact. There is no evidence of arthropathy or other focal bone abnormality. Soft tissues are unremarkable. IMPRESSION: Negative radiographs of the right ankle.  Electronically Signed   By: 02/11/2020 M.D.   On: 02/09/2020 17:29    ____________________________________________    PROCEDURES  Procedure(s) performed:     Procedures     Medications - No data to display   ____________________________________________   INITIAL IMPRESSION / ASSESSMENT AND PLAN / ED COURSE  Pertinent labs & imaging results that were available during my care of the patient were reviewed by me and considered in my medical decision making (see chart for details).     Assessment and plan Right ankle sprain 29 year old female presents to the emergency department with acute right ankle pain after an inversion type ankle injury.  X-ray of the right ankle revealed no bony abnormality.  Ace wrap was applied and crutches were provided.  She was advised to follow-up with podiatry if symptoms do not improve.  She was discharged with meloxicam for pain and inflammation.  All patient  questions were answered.      ____________________________________________  FINAL CLINICAL IMPRESSION(S) / ED DIAGNOSES  Final diagnoses:  Sprain of right ankle, unspecified ligament, initial encounter      NEW MEDICATIONS STARTED DURING THIS VISIT:  ED Discharge Orders         Ordered    meloxicam (MOBIC) 15 MG tablet  Daily        02/09/20 1810              This chart was dictated using voice recognition software/Dragon. Despite best efforts to proofread, errors can occur which can change the meaning. Any change was purely unintentional.     Gasper Lloyd 02/09/20 2011    Delton Prairie, MD 02/10/20 Marlyne Beards

## 2020-02-09 NOTE — ED Notes (Signed)
Pt signed esignature.  Discharge inst to pt.  Crutches and acewrap to right ankle.

## 2020-02-09 NOTE — Discharge Instructions (Addendum)
Take Meloxicam for pain and inflammation.  °

## 2020-02-09 NOTE — ED Triage Notes (Signed)
PT to ED c/o R ankle pain and swelling after a fall 2 days ago.

## 2020-03-26 ENCOUNTER — Ambulatory Visit (LOCAL_COMMUNITY_HEALTH_CENTER): Payer: Medicaid Other

## 2020-03-26 ENCOUNTER — Other Ambulatory Visit: Payer: Self-pay

## 2020-03-26 VITALS — BP 104/62 | Ht 62.0 in | Wt 152.0 lb

## 2020-03-26 DIAGNOSIS — Z3201 Encounter for pregnancy test, result positive: Secondary | ICD-10-CM | POA: Diagnosis not present

## 2020-03-26 LAB — PREGNANCY, URINE: Preg Test, Ur: POSITIVE — AB

## 2020-03-26 MED ORDER — PRENATAL 27-0.8 MG PO TABS: 1.0000 | ORAL_TABLET | Freq: Every day | ORAL | 0 refills | Status: DC

## 2020-03-26 NOTE — Progress Notes (Signed)
UPT positive. Pt denies abd pain and bleeding.  Due to hx placenta abruption, advised to seek immediate med attn/go to ER if has pain that does not go away and /or bleeding.  Unsure about plans to continue pregnancy, but wants to schedule prenatal appt at [facility]. To clerk for prenatal appt/Medicaid/[facility]. Jerel Shepherd, RN

## 2020-04-26 ENCOUNTER — Telehealth: Payer: Self-pay | Admitting: Student

## 2020-04-26 ENCOUNTER — Encounter: Payer: Medicaid Other | Admitting: Student

## 2020-04-26 NOTE — Telephone Encounter (Signed)
TC to patient to reschedule MH IP. Phone got disconnected. Proceeded to schedule patient for IP visit. Left detailed VM about appointment time and date. Sharlyne Pacas, RN

## 2020-05-04 ENCOUNTER — Telehealth: Payer: Self-pay

## 2020-05-04 ENCOUNTER — Emergency Department: Payer: Medicaid Other

## 2020-05-04 ENCOUNTER — Emergency Department
Admission: EM | Admit: 2020-05-04 | Discharge: 2020-05-04 | Disposition: A | Discharge status: Home / Self Care | Payer: Medicaid Other | Attending: Emergency Medicine | Admitting: Emergency Medicine

## 2020-05-04 ENCOUNTER — Other Ambulatory Visit: Payer: Self-pay

## 2020-05-04 DIAGNOSIS — O039 Complete or unspecified spontaneous abortion without complication: Secondary | ICD-10-CM | POA: Diagnosis not present

## 2020-05-04 DIAGNOSIS — O99332 Smoking (tobacco) complicating pregnancy, second trimester: Secondary | ICD-10-CM | POA: Insufficient documentation

## 2020-05-04 DIAGNOSIS — J45909 Unspecified asthma, uncomplicated: Secondary | ICD-10-CM | POA: Insufficient documentation

## 2020-05-04 DIAGNOSIS — F17210 Nicotine dependence, cigarettes, uncomplicated: Secondary | ICD-10-CM | POA: Diagnosis not present

## 2020-05-04 DIAGNOSIS — O99512 Diseases of the respiratory system complicating pregnancy, second trimester: Secondary | ICD-10-CM | POA: Insufficient documentation

## 2020-05-04 DIAGNOSIS — Z3A13 13 weeks gestation of pregnancy: Secondary | ICD-10-CM | POA: Insufficient documentation

## 2020-05-04 DIAGNOSIS — O021 Missed abortion: Secondary | ICD-10-CM

## 2020-05-04 DIAGNOSIS — O209 Hemorrhage in early pregnancy, unspecified: Secondary | ICD-10-CM

## 2020-05-04 DIAGNOSIS — O4691 Antepartum hemorrhage, unspecified, first trimester: Secondary | ICD-10-CM | POA: Diagnosis present

## 2020-05-04 LAB — URINALYSIS, COMPLETE (UACMP) WITH MICROSCOPIC
Bilirubin Urine: NEGATIVE
Glucose, UA: NEGATIVE mg/dL
Hgb urine dipstick: NEGATIVE
Ketones, ur: NEGATIVE mg/dL
Leukocytes,Ua: NEGATIVE
Nitrite: NEGATIVE
Protein, ur: NEGATIVE mg/dL
Specific Gravity, Urine: 1.018 (ref 1.005–1.030)
pH: 7 (ref 5.0–8.0)

## 2020-05-04 LAB — CBC WITH DIFFERENTIAL/PLATELET
Abs Immature Granulocytes: 0.01 10*3/uL (ref 0.00–0.07)
Basophils Absolute: 0 10*3/uL (ref 0.0–0.1)
Basophils Relative: 1 %
Eosinophils Absolute: 0.7 10*3/uL — ABNORMAL HIGH (ref 0.0–0.5)
Eosinophils Relative: 13 %
HCT: 38.2 % (ref 36.0–46.0)
Hemoglobin: 13.4 g/dL (ref 12.0–15.0)
Immature Granulocytes: 0 %
Lymphocytes Relative: 32 %
Lymphs Abs: 1.7 10*3/uL (ref 0.7–4.0)
MCH: 31.8 pg (ref 26.0–34.0)
MCHC: 35.1 g/dL (ref 30.0–36.0)
MCV: 90.5 fL (ref 80.0–100.0)
Monocytes Absolute: 0.4 10*3/uL (ref 0.1–1.0)
Monocytes Relative: 7 %
Neutro Abs: 2.5 10*3/uL (ref 1.7–7.7)
Neutrophils Relative %: 47 %
Platelets: 256 10*3/uL (ref 150–400)
RBC: 4.22 MIL/uL (ref 3.87–5.11)
RDW: 13.3 % (ref 11.5–15.5)
WBC: 5.3 10*3/uL (ref 4.0–10.5)
nRBC: 0 % (ref 0.0–0.2)

## 2020-05-04 LAB — BASIC METABOLIC PANEL
Anion gap: 8 (ref 5–15)
BUN: 7 mg/dL (ref 6–20)
CO2: 22 mmol/L (ref 22–32)
Calcium: 9 mg/dL (ref 8.9–10.3)
Chloride: 105 mmol/L (ref 98–111)
Creatinine, Ser: 0.7 mg/dL (ref 0.44–1.00)
GFR, Estimated: >60 mL/min (ref >60)
Glucose, Bld: 79 mg/dL (ref 70–99)
Potassium: 4.1 mmol/L (ref 3.5–5.1)
Sodium: 135 mmol/L (ref 135–145)

## 2020-05-04 LAB — HCG, QUANTITATIVE, PREGNANCY: hCG, Beta Chain, Quant, S: 6327 m[IU]/mL — ABNORMAL HIGH (ref <5)

## 2020-05-04 LAB — TYPE AND SCREEN
ABO/RH(D): O POS
Antibody Screen: NEGATIVE

## 2020-05-04 MED ORDER — OXYCODONE-ACETAMINOPHEN 5-325 MG PO TABS
1.0000 | ORAL_TABLET | ORAL | Status: DC | PRN
  Administered 2020-05-04: 1 via ORAL
  Filled 2020-05-04: qty 1

## 2020-05-04 MED ORDER — OXYCODONE-ACETAMINOPHEN 5-325 MG PO TABS: 1.0000 | ORAL_TABLET | ORAL | 0 refills | Status: AC | PRN

## 2020-05-04 MED ORDER — IBUPROFEN 600 MG PO TABS: 600.0000 mg | ORAL_TABLET | Freq: Four times a day (QID) | ORAL | 3 refills | Status: AC | PRN

## 2020-05-04 MED ORDER — MISOPROSTOL 200 MCG PO TABS: ORAL_TABLET | ORAL | 0 refills | Status: AC

## 2020-05-04 NOTE — ED Provider Notes (Signed)
[facility] Emergency Department Provider Note  ____________________________________________   Event Date/Time   First MD Initiated Contact with Patient 05/04/20 1808     (approximate)  I have reviewed the triage vital signs and the nursing notes.   HISTORY  Chief Complaint Vaginal Bleeding    HPI Carolyn Petersen is a 30 y.o. female with past medical history as below, G8, here with vaginal bleeding.  The patient states that she believes she is currently about [redacted] weeks pregnant.  She states that starting yesterday, she began to get initially light and now progressively darker vaginal bleeding.  She said associated lower abdominal cramping.  She denies any associated nausea or vomiting.  No associated urinary symptoms.  The pain is aching, gnawing, no specific alleviating or aggravating factors.  She has a history of multiple prior pregnancies with both vaginal and C-section deliveries, as well as 2 medical abortions, but no history of miscarriages.  She believes her gestational age based on her recording of her LMPs.        Past Medical History:  Diagnosis Date  . Anemia    past pregnancy  . Asthma   . Hx of ovarian cyst   . Tooth pain   . Vaginal Pap smear, abnormal    LSIL 2017    Patient Active Problem List   Diagnosis Date Noted  . Missed abortion   . Vaginal birth after cesarean 07/09/2019  . Normal labor 07/08/2019  . Postpartum care following vaginal delivery 07/08/2019  . Positive urine drug screen 12/31/18 MJ, 03/18/19 MJ 03/18/2019  . Encounter for antenatal screening for chromosomal anomalies   . History of cesarean delivery--desires TOLAC 12/31/2018  . History of placental abruption 12/31/2018  . Tooth pain 12/31/2018  . Supervision of high risk pregnancy in first trimester 12/31/2018  . Substance use 12/31/2018  . Grand multipara 12/31/2018  . History of prior pregnancy with IUGR newborn 03/26/2017  . Overweight 12/19/2016     Past Surgical History:  Procedure Laterality Date  . CESAREAN SECTION N/A 08/12/2017   Procedure: CESAREAN SECTION;  Surgeon: Natale Milch, MD;  Location: [facility] ORS;  Service: Obstetrics;  Laterality: N/A;    Prior to Admission medications   Medication Sig Start Date End Date Taking? Authorizing Provider  ibuprofen (ADVIL) 600 MG tablet Take 1 tablet (600 mg total) by mouth every 6 (six) hours as needed. 05/04/20  Yes Vena Austria, MD  misoprostol (CYTOTEC) 200 MCG tablet Place four tablets in between your gums and cheeks (two tablets on each side) as instructed OR insert four tablets vaginally, repeat every 6-hrs as needed 05/04/20  Yes Vena Austria, MD  oxyCODONE-acetaminophen (PERCOCET) 5-325 MG tablet Take 1 tablet by mouth every 4 (four) hours as needed for severe pain. 05/04/20 05/04/21 Yes Vena Austria, MD    Allergies Patient has no known allergies.  Family History  Problem Relation Age of Onset  . Depression Mother   . Schizophrenia Mother   . Bipolar disorder Mother   . Diabetes Mother   . Breast cancer Mother   . Hypertension Mother   . Liver disease Father   . Alcohol abuse Father   . Hypertension Father   . Diabetes Maternal Grandmother   . Ovarian cancer Sister     Social History Social History   Tobacco Use  . Smoking status: Current Some Day Smoker  . Smokeless tobacco: Never Used  . Tobacco comment: "cigarette once or twice"  Vaping Use  .  Vaping Use: Never used  Substance Use Topics  . Alcohol use: Not Currently  . Drug use: Not Currently    Types: Marijuana, Cocaine    Comment: "I don't do that"    Review of Systems  Review of Systems  Constitutional: Negative for chills and fever.  HENT: Negative for sore throat.   Respiratory: Negative for shortness of breath.   Cardiovascular: Negative for chest pain.  Gastrointestinal: Positive for abdominal pain.  Genitourinary: Positive for vaginal bleeding. Negative for flank  pain.  Musculoskeletal: Negative for neck pain.  Skin: Negative for rash and wound.  Allergic/Immunologic: Negative for immunocompromised state.  Neurological: Negative for weakness and numbness.  Hematological: Does not bruise/bleed easily.  All other systems reviewed and are negative.    ____________________________________________  PHYSICAL EXAM:      VITAL SIGNS: ED Triage Vitals [05/04/20 1626]  Enc Vitals Group     BP (!) 110/95     Pulse Rate 78     Resp 16     Temp 98.2 F (36.8 C)     Temp Source Oral     SpO2 99 %     Weight 157 lb (71.2 kg)     Height 5\' 2"  (1.575 m)     Head Circumference      Peak Flow      Pain Score 5     Pain Loc      Pain Edu?      Excl. in GC?      Physical Exam Vitals and nursing note reviewed.  Constitutional:      General: She is not in acute distress.    Appearance: She is well-developed.  HENT:     Head: Normocephalic and atraumatic.  Eyes:     Conjunctiva/sclera: Conjunctivae normal.  Cardiovascular:     Rate and Rhythm: Normal rate and regular rhythm.     Heart sounds: Normal heart sounds.  Pulmonary:     Effort: Pulmonary effort is normal. No respiratory distress.     Breath sounds: No wheezing.  Abdominal:     General: Abdomen is flat. There is no distension.     Tenderness: There is abdominal tenderness (Mild, suprapubic).  Musculoskeletal:     Cervical back: Neck supple.  Skin:    General: Skin is warm.     Capillary Refill: Capillary refill takes less than 2 seconds.     Findings: No rash.  Neurological:     Mental Status: She is alert and oriented to person, place, and time.     Motor: No abnormal muscle tone.       ____________________________________________   LABS (all labs ordered are listed, but only abnormal results are displayed)  Labs Reviewed  CBC WITH DIFFERENTIAL/PLATELET - Abnormal; Notable for the following components:      Result Value   Eosinophils Absolute 0.7 (*)    All other  components within normal limits  HCG, QUANTITATIVE, PREGNANCY - Abnormal; Notable for the following components:   hCG, Beta Chain, Quant, S 6,327 (*)    All other components within normal limits  URINALYSIS, COMPLETE (UACMP) WITH MICROSCOPIC - Abnormal; Notable for the following components:   Color, Urine YELLOW (*)    APPearance HAZY (*)    Bacteria, UA RARE (*)    All other components within normal limits  BASIC METABOLIC PANEL  TYPE AND SCREEN    ____________________________________________  EKG:  ________________________________________  RADIOLOGY All imaging, including plain films, CT scans, and ultrasounds, independently reviewed  by me, and interpretations confirmed via formal radiology reads.  ED MD interpretation:   OB ultrasound: Imaging c/w failed pregnancy  Official radiology report(s): US OB LESS THAN 14 WEEKS WITH OB TRANSVAGINAL  Result Date: 05/04/2020 CLINICAL DATA:  Vaginal bleeding EXAM: OBSTETRIC <14 WK [place] AND TRANSVAGINAL OB US TECHNIQUE: Both transabdominal and transvaginal ultrasound examinations were performed for complete evaluation of the gestation as well as the maternal uterus, adnexal regions, and pelvic cul-de-sac. Transvaginal technique was performed to assess early pregnancy. COMPARISON:  None. FINDINGS: Intrauterine gestational sac: Single Yolk sac:  Not Visualized. Embryo:  Visualized. Cardiac Activity: Not Visualized. CRL:  21 mm   8 w   6 d Subchorionic hemorrhage:  Small Maternal uterus/adnexae: Normal appearing ovaries IMPRESSION: Findings meet definitive criteria for failed pregnancy. This follows SRU consensus guidelines: Diagnostic Criteria for Nonviable Pregnancy Early in the First Trimester. [facility] Mis J Med [phone]. Electronically Signed   By: Jonna Clark M.D.   On: 05/04/2020 20:25    ____________________________________________  PROCEDURES   Procedure(s) performed (including Critical  Care):  Procedures  ____________________________________________  INITIAL IMPRESSION / MDM / ASSESSMENT AND PLAN / ED COURSE  As part of my medical decision making, I reviewed the following data within the electronic MEDICAL RECORD NUMBER Nursing notes reviewed and incorporated, Old chart reviewed, Notes from prior ED visits, and [place] Controlled Substance Database       *Niajah Sipos was evaluated in Emergency Department on 05/04/2020 for the symptoms described in the history of present illness. She was evaluated in the context of the global COVID-19 pandemic, which necessitated consideration that the patient might be at risk for infection with the SARS-CoV-2 virus that causes COVID-19. Institutional protocols and algorithms that pertain to the evaluation of patients at risk for COVID-19 are in a state of rapid change based on information released by regulatory bodies including the CDC and federal and state organizations. These policies and algorithms were followed during the patient's care in the ED.  Some ED evaluations and interventions may be delayed as a result of limited staffing during the pandemic.*     Medical Decision Making:  30 yo F here with vaginal bleeding. Pt is G8P5, currently estimated [redacted] wk GA based on LMP. Unfortunately, US obtained today shows failed pregnancy. No signs of active hemorrhage. Hgb is at baseline. HDS. She is O+, Rhogam not indicated. Case discussed with Dr. Bonney Aid who will see pt in ED. Likely d/c after discussion. Pt updated, in agreement with plan.  ____________________________________________  FINAL CLINICAL IMPRESSION(S) / ED DIAGNOSES  Final diagnoses:  Miscarriage     MEDICATIONS GIVEN DURING THIS VISIT:  Medications  oxyCODONE-acetaminophen (PERCOCET/ROXICET) 5-325 MG per tablet 1 tablet (1 tablet Oral Given 05/04/20 2108)     ED Discharge Orders         Ordered    ibuprofen (ADVIL) 600 MG tablet  Every 6 hours PRN        05/04/20 2100     misoprostol (CYTOTEC) 200 MCG tablet        05/04/20 2100    oxyCODONE-acetaminophen (PERCOCET) 5-325 MG tablet  Every 4 hours PRN        05/04/20 2103           Note:  This document was prepared using Dragon voice recognition software and may include unintentional dictation errors.   Shaune Pollack, MD 05/04/20 2237

## 2020-05-04 NOTE — ED Triage Notes (Signed)
Patient [redacted]weeks pregnant. Bleeding started yesterday, noticed small blood clot in the toilet and blood when wiping but not soaking pad. +cramping. Awake alert in NAD.

## 2020-05-04 NOTE — Consult Note (Signed)
Obstetrics & Gynecology Consult H&P   Consulting Department: Emergency Deparment  Consulting Physician: Shaune PollackIsaacs, Cameron MD  Consulting Question: Missed abortion   History of Present Illness: Patient is a 30 y.o. [id number] with Patient's last menstrual period was 02/04/2020 (exact date). presenting with positive urine pregnancy test and vaginal spotting.  No significant cramping.  The patient should be about 12 weeks 6 days based on stated LMP.  ER ultrasound displayed a IUP with CRL of 21mm with no visualized fetal cardiac activity.    Review of Systems:10 point review of systems  Past Medical History:  Patient Active Problem List   Diagnosis Date Noted  . Vaginal birth after cesarean 07/09/2019  . Normal labor 07/08/2019    Admitted 525/2021 in active labor.   . Postpartum care following vaginal delivery 07/08/2019    SVD 07/08/2019 Successful VBAC   . Positive urine drug screen 12/31/18 MJ, 03/18/19 MJ 03/18/2019    +UDS MJ 12/31/18, 03/18/19 MJ +UDS cocaine 07/26/17 "Plan of Safe Care" info given 05/29/19   . Encounter for antenatal screening for chromosomal anomalies   . History of cesarean delivery--desires TOLAC 12/31/2018    07/2017 d/t placental abruption Needs delivery plans appt 34-36 wks with KC   . History of placental abruption 12/31/2018    07/2017 in setting of marijuana and cocaine use   . Tooth pain 12/31/2018    Took percocet during early preg, rx from ER    . Supervision of high risk pregnancy in first trimester 12/31/2018     Nursing Staff Provider  Office Location  [facility] Dating  LMP/12wk  Language  English Anatomy US    Flu Vaccine   Declined 12/31/18 Genetic Screen  NIPS:   AFP:   First Screen:  Quad:    TDaP vaccine   05/29/2019 Hgb A1C or  GTT Early  Third trimester   Rhogam   NA   LAB RESULTS   Feeding Plan  Blood Type O/Positive/-- (11/17 1540)   Contraception Nexplanon Antibody Negative (11/17 1540)  Circumcision  Rubella   Immune  Pediatrician    RPR Non Reactive (11/17 1540)   Support Person  HBsAg Negative (11/17 1540)   Prenatal Classes  HIV    @28wk - Doula referral?  Varicella Lab Results  Component Value Date   VZVIGG 435 02/16/2017  ]  BTL Consent  GBS  (For PCN allergy, check sensitivities)        VBAC Consent  Pap  NIL 11/12/2018    Hgb Electro   Normal (09/15/09)  BP Cuff ordered  CF   Delivery Group  [facility]   Centering Group         . Substance use 12/31/2018    Marijuana +12/31/18.  Past cocaine use 1 yr ago. "Plan of Safe Care" info given 05/29/19   . Grand multipara 12/31/2018    [id number]   . History of prior pregnancy with IUGR newborn 03/26/2017  . Overweight 12/19/2016    BMI=27.8     Past Surgical History:  Past Surgical History:  Procedure Laterality Date  . CESAREAN SECTION N/A 08/12/2017   Procedure: CESAREAN SECTION;  Surgeon: Natale MilchSchuman, Christanna R, MD;  Location: [facility] ORS;  Service: Obstetrics;  Laterality: N/A;    Gynecologic History:   Obstetric History: [id number]: G8P6016  Family History:  Family History  Problem Relation Age of Onset  . Depression Mother   . Schizophrenia Mother   . Bipolar disorder Mother   . Diabetes Mother   . Breast  cancer Mother   . Hypertension Mother   . Liver disease Father   . Alcohol abuse Father   . Hypertension Father   . Diabetes Maternal Grandmother   . Ovarian cancer Sister     Social History:  Social History   Socioeconomic History  . Marital status: Single    Spouse name: Not on file  . Number of children: 6  . Years of education: 41  . Highest education level: 10th grade  Occupational History  . Not on file  Tobacco Use  . Smoking status: Current Some Day Smoker  . Smokeless tobacco: Never Used  . Tobacco comment: "cigarette once or twice"  Vaping Use  . Vaping Use: Never used  Substance and Sexual Activity  . Alcohol use: Not Currently  . Drug use: Not Currently    Types: Marijuana, Cocaine    Comment: "I don't do that"  . Sexual  activity: Yes    Birth control/protection: None  Other Topics Concern  . Not on file  Social History Narrative  . Not on file   Social Determinants of Health   Financial Resource Strain: Not on file  Food Insecurity: Not on file  Transportation Needs: Not on file  Physical Activity: Not on file  Stress: Not on file  Social Connections: Not on file  Intimate Partner Violence: Not At Risk  . Fear of Current or Ex-Partner: No  . Emotionally Abused: No  . Physically Abused: No  . Sexually Abused: No    Allergies:  No Known Allergies  Medications: Prior to Admission medications   Medication Sig Start Date End Date Taking? Authorizing Provider  acetaminophen (TYLENOL) 325 MG tablet Take 2 tablets (650 mg total) by mouth every 4 (four) hours as needed for mild pain or moderate pain (for pain scale < 4ORtemperature>/=100.5 F). 07/09/19  Yes Farrel Conners, CNM  Prenatal Vit-Fe Fumarate-FA (MULTIVITAMIN-PRENATAL) 27-0.8 MG TABS tablet Take 1 tablet by mouth daily at 12 noon. 03/26/20 07/04/20 Yes Federico Flake, MD    Physical Exam Vitals: Blood pressure (!) 110/95, pulse 78, temperature 98.2 F (36.8 C), temperature source Oral, resp. rate 16, height 5\' 2"  (1.575 m), weight 71.2 kg, last menstrual period 02/04/2020, SpO2 99 %, not currently breastfeeding.  General: NAD, well nourished, appears stated age HEENT: normocephalic, anicteric Pulmonary: No increased work of breathing Genitourinary: deferred Extremities: no edema, erythema, or tenderness Neurologic: Grossly intact Psychiatric: mood appropriate, affect full  Labs: Results for orders placed or performed during the hospital encounter of 05/04/20 (from the past 72 hour(s))  CBC with Differential     Status: Abnormal   Collection Time: 05/04/20  4:31 PM  Result Value Ref Range   WBC 5.3 4.0 - 10.5 K/uL   RBC 4.22 3.87 - 5.11 MIL/uL   Hemoglobin 13.4 12.0 - 15.0 g/dL   HCT 05/06/20 40.9 - 81.1 %   MCV 90.5  80.0 - 100.0 fL   MCH 31.8 26.0 - 34.0 pg   MCHC 35.1 30.0 - 36.0 g/dL   RDW 91.4 78.2 - 95.6 %   Platelets 256 150 - 400 K/uL   nRBC 0.0 0.0 - 0.2 %   Neutrophils Relative % 47 %   Neutro Abs 2.5 1.7 - 7.7 K/uL   Lymphocytes Relative 32 %   Lymphs Abs 1.7 0.7 - 4.0 K/uL   Monocytes Relative 7 %   Monocytes Absolute 0.4 0.1 - 1.0 K/uL   Eosinophils Relative 13 %   Eosinophils Absolute 0.7 (H)  0.0 - 0.5 K/uL   Basophils Relative 1 %   Basophils Absolute 0.0 0.0 - 0.1 K/uL   Immature Granulocytes 0 %   Abs Immature Granulocytes 0.01 0.00 - 0.07 K/uL    Comment: Performed at [facility], [address] [place], [place] [address]  Basic metabolic panel     Status: None   Collection Time: 05/04/20  4:31 PM  Result Value Ref Range   Sodium 135 135 - 145 mmol/L   Potassium 4.1 3.5 - 5.1 mmol/L   Chloride 105 98 - 111 mmol/L   CO2 22 22 - 32 mmol/L   Glucose, Bld 79 70 - 99 mg/dL    Comment: Glucose reference range applies only to samples taken after fasting for at least 8 hours.   BUN 7 6 - 20 mg/dL   Creatinine, Ser 6.04 0.44 - 1.00 mg/dL   Calcium 9.0 8.9 - 54.0 mg/dL   GFR, Estimated >98 >11 mL/min    Comment: (NOTE) Calculated using the CKD-EPI Creatinine Equation (2021)    Anion gap 8 5 - 15    Comment: Performed at [facility], [address] [place], [place] [address]  Type and screen [facility]     Status: None   Collection Time: 05/04/20  4:31 PM  Result Value Ref Range   ABO/RH(D) O POS    Antibody Screen NEG    Sample Expiration      05/07/2020,2359 Performed at [facility], [address] [place], [place] [address]   hCG, quantitative, pregnancy     Status: Abnormal   Collection Time: 05/04/20  4:31 PM  Result Value Ref Range   hCG, Beta Chain, Quant, S 6,327 (H) <5 mIU/mL    Comment:          GEST. AGE      CONC.  (mIU/mL)   <=1 WEEK        5 - 50     2 WEEKS       50 - 500     3 WEEKS       100 -  10,000     4 WEEKS     1,000 - 30,000     5 WEEKS     3,500 - 115,000   6-8 WEEKS     12,000 - 270,000    12 WEEKS     15,000 - 220,000        FEMALE AND NON-PREGNANT FEMALE:     LESS THAN 5 mIU/mL Performed at [facility], [address] [place], [place] [address]   Urinalysis, Complete w Microscopic Urine, Clean Catch     Status: Abnormal   Collection Time: 05/04/20  6:30 PM  Result Value Ref Range   Color, Urine YELLOW (A) YELLOW   APPearance HAZY (A) CLEAR   Specific Gravity, Urine 1.018 1.005 - 1.030   pH 7.0 5.0 - 8.0   Glucose, UA NEGATIVE NEGATIVE mg/dL   Hgb urine dipstick NEGATIVE NEGATIVE   Bilirubin Urine NEGATIVE NEGATIVE   Ketones, ur NEGATIVE NEGATIVE mg/dL   Protein, ur NEGATIVE NEGATIVE mg/dL   Nitrite NEGATIVE NEGATIVE   Leukocytes,Ua NEGATIVE NEGATIVE   RBC / HPF 0-5 0 - 5 RBC/hpf   WBC, UA 0-5 0 - 5 WBC/hpf   Bacteria, UA RARE (A) NONE SEEN   Squamous Epithelial / LPF 0-5 0 - 5   Amorphous Crystal PRESENT     Comment: Performed at [facility],  [address] [place], [place] [address]    Imaging US OB LESS THAN 14 WEEKS WITH OB TRANSVAGINAL  Result Date: 05/04/2020 CLINICAL DATA:  Vaginal bleeding EXAM: OBSTETRIC <14 WK [place] AND TRANSVAGINAL OB US TECHNIQUE: Both transabdominal and transvaginal ultrasound examinations were performed for complete evaluation of the gestation as well as the maternal uterus, adnexal regions, and pelvic cul-de-sac. Transvaginal technique was performed to assess early pregnancy. COMPARISON:  None. FINDINGS: Intrauterine gestational sac: Single Yolk sac:  Not Visualized. Embryo:  Visualized. Cardiac Activity: Not Visualized. CRL:  21 mm   8 w   6 d Subchorionic hemorrhage:  Small Maternal uterus/adnexae: Normal appearing ovaries IMPRESSION: Findings meet definitive criteria for failed pregnancy. This follows SRU consensus guidelines: Diagnostic Criteria for Nonviable Pregnancy Early in the First Trimester. Carolyn Petersen  Med [phone]. Electronically Signed   By: Jonna Clark M.D.   On: 05/04/2020 20:25    Assessment: 30 y.o. [id number] with missed abortion at 8 weeks 6 days gestation based on CRL  Plan:   Condolences were offered to the patient and her family.  I stressed that while emotionally difficult, that this did not occur because of an actions or inactions by the patient.  Somewhere between 10-20% of identified first trimester pregnancies will unfortunately end in miscarriage.  Given this relatively high incidence rate, further diagnostic testing such as chromosome analysis is generally not clinically relevant nor recommended.  Although the chromosomal abnormalities have been implicated at rates as high as 70% in some studies, these are generally random and do not infer and increased risk of recurrence with subsequent pregnancies.  However, 3 or more consecutive first trimester losses are relatively uncommon, and these patient generally do benefit from additional work up to determine a potential modifiable etiology.   We briefly discussed management options including expectant management, medical management, and surgical management as well as their relative success rates and complications. Approximately 80% of first trimester miscarriages will pass successfully but may require a time frame of up to 8 weeks (ACOG Practice Bulletin 150 May 2015 "Early Pregnancy Loss").  Medical management using of misoprostil administered every 3-hrs as needed for up to 3 doses speeds up the time frame to completion significantly, has literature supporting its use up to 63 days or [redacted]w[redacted]d gestation and results in a passage rate of 84-85% (ACOG Practice Bulletin 143 March 2014 "Medical Management of First-Trimester Abortion").  Dilation and curettage has the highest rate of uterine evacuation, but carries with is operative cost, surgical and anesthetic risk.  While these risk are relatively small they nevertheless include  infection, bleeding, uterine perforation, formation of uterine synechia, and in rare cases death.   We discussed repeat ultrasound and or trending HCG levels if the patient wishes to pursue these prior to making her decision.  Clinically I am confident of the diagnosis, but I do not want any doubts in the patient's mind regarding the plan of management she chooses to adopt.  I will allow the patient and her family to discuss management options and she was advised to contact the office to arrange final disposition one she has made her decision or should she have any follow up questions for myself.  Given CRL >61mm and no fetal heart tones visualized meets ultrasound criteria for missed abortion  "Society of Radiologyst in Ultrasound Guidelines for Transvaginal Ultrasonographic Diagnosis of Early Pregnancy Loss" and adopted in ACOG Practice Bulletin Number 150, May 2015 (reaffirmed 2017) "Early Pregnancy Loss"  The patient  is O POS  and rhogam is therefore not indicated.    - patient most interested in medical management.  Rx cytotec, percocet, and ibuprofen provided - will arrange clinic follow up sometime in the next week to verify completion - A total of 30 minutes were spent in face-to-face contact with the patient during this encounter with over half of that time devoted to counseling and coordination of care.     Vena Austria, MD, [facility], [facility] 05/04/2020, 8:37 PM

## 2020-05-04 NOTE — Telephone Encounter (Signed)
Received call from Carolyn Petersen that client needs to speak with maternity RN. Call transferred and client reports thinks she is having a miscarriage as light vaginal bleeding on tissue after voiding began on 05/02/20. Began having mild menstrual like cramps on 05/03/20 and bleeding on tissue darker. This am reports vaginal bleeding on tissue is much darker, cramping continues and "clot plopped into toilet this am when I peed.' Reports crotch of black panties wet on awakening this am (could not tell color of blood due to panty color). Per Elveria Rising FNP-BC, ED evaluation recommended. [facility] IP appt cancelled for this pm and client to reschedule pending results of ED evaluation. Client agreeable with plan. Jossie Ng, RN

## 2020-05-04 NOTE — ED Notes (Signed)
OB/Gyn provider at bedside

## 2020-05-04 NOTE — ED Notes (Signed)
Patient transported to Ultrasound 

## 2020-05-04 NOTE — ED Notes (Signed)
ED Provider at bedside. 

## 2020-05-05 ENCOUNTER — Telehealth: Payer: Self-pay

## 2020-05-05 NOTE — Telephone Encounter (Signed)
-----   Message from Vena Austria, MD sent at 05/04/2020  9:01 PM EDT ----- Regarding: MD follow up MD follow up 1 week ER follow up missed abortion   Vena Austria, MD, [facility], [facility] 05/04/2020, 9:01 PM

## 2020-05-05 NOTE — Telephone Encounter (Signed)
Attempt to reach patient to schedule voicemail box is full unable to leave message

## 2020-05-06 ENCOUNTER — Other Ambulatory Visit: Payer: Self-pay

## 2020-05-06 ENCOUNTER — Emergency Department
Admission: EM | Admit: 2020-05-06 | Discharge: 2020-05-06 | Disposition: A | Discharge status: Home / Self Care | Payer: Medicaid Other | Attending: Emergency Medicine | Admitting: Emergency Medicine

## 2020-05-06 DIAGNOSIS — O034 Incomplete spontaneous abortion without complication: Secondary | ICD-10-CM | POA: Diagnosis not present

## 2020-05-06 DIAGNOSIS — F17210 Nicotine dependence, cigarettes, uncomplicated: Secondary | ICD-10-CM | POA: Diagnosis not present

## 2020-05-06 DIAGNOSIS — O99332 Smoking (tobacco) complicating pregnancy, second trimester: Secondary | ICD-10-CM | POA: Diagnosis not present

## 2020-05-06 DIAGNOSIS — Z3A13 13 weeks gestation of pregnancy: Secondary | ICD-10-CM | POA: Insufficient documentation

## 2020-05-06 DIAGNOSIS — J45909 Unspecified asthma, uncomplicated: Secondary | ICD-10-CM | POA: Insufficient documentation

## 2020-05-06 DIAGNOSIS — O4692 Antepartum hemorrhage, unspecified, second trimester: Secondary | ICD-10-CM | POA: Diagnosis present

## 2020-05-06 DIAGNOSIS — O99512 Diseases of the respiratory system complicating pregnancy, second trimester: Secondary | ICD-10-CM | POA: Insufficient documentation

## 2020-05-06 LAB — CBC WITH DIFFERENTIAL/PLATELET
Abs Immature Granulocytes: 0.01 10*3/uL (ref 0.00–0.07)
Basophils Absolute: 0 10*3/uL (ref 0.0–0.1)
Basophils Relative: 1 %
Eosinophils Absolute: 0.5 10*3/uL (ref 0.0–0.5)
Eosinophils Relative: 7 %
HCT: 32.7 % — ABNORMAL LOW (ref 36.0–46.0)
Hemoglobin: 11.5 g/dL — ABNORMAL LOW (ref 12.0–15.0)
Immature Granulocytes: 0 %
Lymphocytes Relative: 23 %
Lymphs Abs: 1.9 10*3/uL (ref 0.7–4.0)
MCH: 31.3 pg (ref 26.0–34.0)
MCHC: 35.2 g/dL (ref 30.0–36.0)
MCV: 88.9 fL (ref 80.0–100.0)
Monocytes Absolute: 0.7 10*3/uL (ref 0.1–1.0)
Monocytes Relative: 8 %
Neutro Abs: 5.1 10*3/uL (ref 1.7–7.7)
Neutrophils Relative %: 61 %
Platelets: 255 10*3/uL (ref 150–400)
RBC: 3.68 MIL/uL — ABNORMAL LOW (ref 3.87–5.11)
RDW: 13.2 % (ref 11.5–15.5)
WBC: 8.2 10*3/uL (ref 4.0–10.5)
nRBC: 0 % (ref 0.0–0.2)

## 2020-05-06 MED ORDER — KETOROLAC TROMETHAMINE 30 MG/ML IJ SOLN
15.0000 mg | Freq: Once | INTRAMUSCULAR | Status: AC
  Administered 2020-05-06: 15 mg via INTRAVENOUS
  Filled 2020-05-06: qty 1

## 2020-05-06 NOTE — ED Notes (Signed)
ED Provider at bedside. 

## 2020-05-06 NOTE — Discharge Instructions (Signed)
Continue your prescription medications and follow-up with your OB/GYN.  I have attached the phone number for Dr. Bonney Aid for your reference, please call his clinic to be seen in the next 3-5 days.  If you develop any fevers, worsening or poorly controlled abdominal pain, dizziness and passing out, please return to the ED.

## 2020-05-06 NOTE — ED Provider Notes (Addendum)
[facility] Emergency Department Provider Note ____________________________________________   Event Date/Time   First MD Initiated Contact with Patient 05/06/20 0205     (approximate)  I have reviewed the triage vital signs and the nursing notes.  HISTORY  Chief Complaint Vaginal Bleeding   HPI Carolyn Petersen is a 30 y.o. femalewho presents to the ED for evaluation of   Chart review indicates patient is a G8 P6-0-1-6 whose LMP was 02/04/2020.  She was seen about 24 hours ago in our ED due to vaginal bleeding and concern for a missed abortion.  Ultrasound at that time demonstrated IUP with no cardiac activity.  OB/GYN, Dr. Bonney Aid, was consulted and evaluated the patient, with resultant plan to provide medical management with Cytotec and outpatient follow-up in about a week.  Patient reports taking the Cytotec earlier today and developing increasing vaginal bleeding throughout the evening, with passage of blood clots.  She reports being surprised at the volume of blood and the blood clots, scared that she was losing too much blood, and presents to the ED for evaluation.  She reports using the ibuprofen, as recently as about 4 hours ago, with improvement of her pelvic cramping.  She denies any worsening pain and report that she feels fine otherwise, she "was just scared about the blood clots."  Denies fever, dizziness, syncope, emesis, diarrhea, dysuria.   Past Medical History:  Diagnosis Date  . Anemia    past pregnancy  . Asthma   . Hx of ovarian cyst   . Tooth pain   . Vaginal Pap smear, abnormal    LSIL 2017    Patient Active Problem List   Diagnosis Date Noted  . Missed abortion   . Vaginal birth after cesarean 07/09/2019  . Normal labor 07/08/2019  . Postpartum care following vaginal delivery 07/08/2019  . Positive urine drug screen 12/31/18 MJ, 03/18/19 MJ 03/18/2019  . Encounter for antenatal screening for chromosomal anomalies   . History  of cesarean delivery--desires TOLAC 12/31/2018  . History of placental abruption 12/31/2018  . Tooth pain 12/31/2018  . Supervision of high risk pregnancy in first trimester 12/31/2018  . Substance use 12/31/2018  . Grand multipara 12/31/2018  . History of prior pregnancy with IUGR newborn 03/26/2017  . Overweight 12/19/2016    Past Surgical History:  Procedure Laterality Date  . CESAREAN SECTION N/A 08/12/2017   Procedure: CESAREAN SECTION;  Surgeon: Natale Milch, MD;  Location: [facility] ORS;  Service: Obstetrics;  Laterality: N/A;    Prior to Admission medications   Medication Sig Start Date End Date Taking? Authorizing Provider  ibuprofen (ADVIL) 600 MG tablet Take 1 tablet (600 mg total) by mouth every 6 (six) hours as needed. 05/04/20   Vena Austria, MD  misoprostol (CYTOTEC) 200 MCG tablet Place four tablets in between your gums and cheeks (two tablets on each side) as instructed OR insert four tablets vaginally, repeat every 6-hrs as needed 05/04/20   Vena Austria, MD  oxyCODONE-acetaminophen (PERCOCET) 5-325 MG tablet Take 1 tablet by mouth every 4 (four) hours as needed for severe pain. 05/04/20 05/04/21  Vena Austria, MD    Allergies Patient has no known allergies.  Family History  Problem Relation Age of Onset  . Depression Mother   . Schizophrenia Mother   . Bipolar disorder Mother   . Diabetes Mother   . Breast cancer Mother   . Hypertension Mother   . Liver disease Father   . Alcohol abuse Father   .  Hypertension Father   . Diabetes Maternal Grandmother   . Ovarian cancer Sister     Social History Social History   Tobacco Use  . Smoking status: Current Some Day Smoker  . Smokeless tobacco: Never Used  . Tobacco comment: "cigarette once or twice"  Vaping Use  . Vaping Use: Never used  Substance Use Topics  . Alcohol use: Not Currently  . Drug use: Not Currently    Types: Marijuana, Cocaine    Comment: "I don't do that"    Review of  Systems  Constitutional: No fever/chills Eyes: No visual changes. ENT: No sore throat. Cardiovascular: Denies chest pain. Respiratory: Denies shortness of breath. Gastrointestinal: .  No nausea, no vomiting.  No diarrhea.  No constipation. Positive for pelvic cramping, improving. Genitourinary: Negative for dysuria.  Positive for vaginal bleeding. Musculoskeletal: Negative for back pain. Skin: Negative for rash. Neurological: Negative for headaches, focal weakness or numbness.  ____________________________________________   PHYSICAL EXAM:  VITAL SIGNS: Vitals:   05/06/20 0207 05/06/20 0230  BP: 113/67 116/62  Pulse: 96 92  Resp: 17   Temp: 98.7 F (37.1 C)   SpO2: 98%      Constitutional: Alert and oriented. Well appearing and in no acute distress. Eyes: Conjunctivae are normal. PERRL. EOMI. Head: Atraumatic. Nose: No congestion/rhinnorhea. Mouth/Throat: Mucous membranes are moist.  Oropharynx non-erythematous. Neck: No stridor. No cervical spine tenderness to palpation. Cardiovascular: Normal rate, regular rhythm. Grossly normal heart sounds.  Good peripheral circulation. Respiratory: Normal respiratory effort.  No retractions. Lungs CTAB. Gastrointestinal: Soft , nondistended. No CVA tenderness. Minimal suprapubic tenderness, otherwise benign abdomen.  No peritoneal features.   Musculoskeletal: No lower extremity tenderness nor edema.  No joint effusions. No signs of acute trauma. Neurologic:  Normal speech and language. No gross focal neurologic deficits are appreciated. No gait instability noted. Skin:  Skin is warm, dry and intact. No rash noted. Psychiatric: Mood and affect are normal. Speech and behavior are normal.  ____________________________________________   LABS (all labs ordered are listed, but only abnormal results are displayed)  Labs Reviewed  CBC WITH DIFFERENTIAL/PLATELET - Abnormal; Notable for the following components:      Result Value   RBC  3.68 (*)    Hemoglobin 11.5 (*)    HCT 32.7 (*)    All other components within normal limits    _______________________________________   MDM / ED COURSE   30 year old woman presents to the ED with vaginal bleeding and passage of clots after taking Cytotec in the setting of missed abortion, likely representing appropriate passage of intrauterine material.  Normal vitals on room air.  Exam is quite reassuring.  She looks well without any distress or signs of neurovascular deficits.  Her abdomen is essentially benign throughout.  She indicates some mild tenderness upon deep palpation of her suprapubic abdomen, otherwise nontender.  With how well she looks and with how recently she was just evaluated, I do not see an indication to repeat pelvic imaging at this time.  We will provide a dose of Toradol because an IV is already been placed, and check a CBC to ensure no significant drop in her hemoglobin.  If this is as reassuring as her clinical picture, anticipate should be suitable for outpatient management with close OB/GYN follow-up as previously arranged.   Clinical Course as of 05/06/20 0313  Thu May 06, 2020  0312 Reassessed.  Patient reports feeling well.  We discussed slight decrement in her hemoglobin.  She denies any dizziness upon  standing, syncopal episodes or bleeding diatheses beyond her vaginal bleeding.  We discussed close follow-up with her OB/GYN and we discussed [DS]  0312  return precautions for the ED. [DS]    Clinical Course User Index [DS] Delton Prairie, MD    ____________________________________________   FINAL CLINICAL IMPRESSION(S) / ED DIAGNOSES  Final diagnoses:  Incomplete miscarriage     ED Discharge Orders    None       Quinterius Gaida Katrinka Blazing   Note:  This document was prepared using Dragon voice recognition software and may include unintentional dictation errors.   Delton Prairie, MD 05/06/20 6294    Delton Prairie, MD 05/06/20 [phone]

## 2020-05-06 NOTE — ED Triage Notes (Signed)
Pt BIB [facility]. Pt was seen yesterday for a miscarage, was given medication for it and took it today. Pt states a lot of vaginal bleeding with clotting since and wanted to be checked out

## 2020-05-06 NOTE — ED Notes (Signed)
Patient reports she has been wearing adult diapers x 24 hours. Patient reports soaking through one right now. Patient provided brief, and maternity pad.

## 2020-05-06 NOTE — ED Notes (Signed)
Pelvic cart to bedside 

## 2020-05-06 NOTE — ED Notes (Signed)
Vital signs stable. 

## 2020-05-12 ENCOUNTER — Telehealth: Payer: Self-pay | Admitting: Obstetrics and Gynecology

## 2020-05-12 ENCOUNTER — Other Ambulatory Visit: Payer: Self-pay

## 2020-05-12 ENCOUNTER — Encounter: Payer: Self-pay | Admitting: Obstetrics and Gynecology

## 2020-05-12 ENCOUNTER — Ambulatory Visit (INDEPENDENT_AMBULATORY_CARE_PROVIDER_SITE_OTHER): Payer: Medicaid Other | Admitting: Obstetrics and Gynecology

## 2020-05-12 VITALS — BP 110/68 | Ht 62.0 in | Wt 150.0 lb

## 2020-05-12 DIAGNOSIS — O039 Complete or unspecified spontaneous abortion without complication: Secondary | ICD-10-CM

## 2020-05-12 DIAGNOSIS — Z3009 Encounter for other general counseling and advice on contraception: Secondary | ICD-10-CM | POA: Diagnosis not present

## 2020-05-12 NOTE — Telephone Encounter (Signed)
Patient coming in on 05/17/2020 at 3:10 to see AMS for nexplanon insertion

## 2020-05-12 NOTE — Progress Notes (Signed)
Obstetrics & Gynecology Office Visit   Chief Complaint:  Chief Complaint  Patient presents with  . Follow-up    ER F/U Missed AB - still having lower ABD pain and bleeding no clots. RM 4    History of Present Illness: 30 y.o. [id number] presenting for medication follow up for a diagnosis of missed abortion.  The patient completed a course of cyotec.   The patient reports that she did pass tissue with cytotec.  Bleeding has slowed down but continues with some lighter cramping.  .   She has not noted any side-effects or new symptoms.   Is not interested in pregnancy in the near future.  Review of Systems: Review of Systems  Constitutional: Negative.   Gastrointestinal: Positive for abdominal pain. Negative for blood in stool, constipation, diarrhea, heartburn, melena, nausea and vomiting.  Genitourinary: Negative.      Past Medical History:  Past Medical History:  Diagnosis Date  . Anemia    past pregnancy  . Asthma   . Hx of ovarian cyst   . Tooth pain   . Vaginal Pap smear, abnormal    LSIL 2017    Past Surgical History:  Past Surgical History:  Procedure Laterality Date  . CESAREAN SECTION N/A 08/12/2017   Procedure: CESAREAN SECTION;  Surgeon: Natale Milch, MD;  Location: [facility] ORS;  Service: Obstetrics;  Laterality: N/A;    Gynecologic History: Patient's last menstrual period was 02/04/2020 (exact date).  Obstetric History: [id number]  Family History:  Family History  Problem Relation Age of Onset  . Depression Mother   . Schizophrenia Mother   . Bipolar disorder Mother   . Diabetes Mother   . Breast cancer Mother   . Hypertension Mother   . Liver disease Father   . Alcohol abuse Father   . Hypertension Father   . Diabetes Maternal Grandmother   . Ovarian cancer Sister     Social History:  Social History   Socioeconomic History  . Marital status: Single    Spouse name: Not on file  . Number of children: 6  . Years of education: 43  .  Highest education level: 10th grade  Occupational History  . Not on file  Tobacco Use  . Smoking status: Current Some Day Smoker  . Smokeless tobacco: Never Used  . Tobacco comment: "cigarette once or twice"  Vaping Use  . Vaping Use: Never used  Substance and Sexual Activity  . Alcohol use: Not Currently  . Drug use: Not Currently    Types: Marijuana, Cocaine    Comment: "I don't do that"  . Sexual activity: Yes    Birth control/protection: None  Other Topics Concern  . Not on file  Social History Narrative  . Not on file   Social Determinants of Health   Financial Resource Strain: Not on file  Food Insecurity: Not on file  Transportation Needs: Not on file  Physical Activity: Not on file  Stress: Not on file  Social Connections: Not on file  Intimate Partner Violence: Not At Risk  . Fear of Current or Ex-Partner: No  . Emotionally Abused: No  . Physically Abused: No  . Sexually Abused: No    Allergies:  No Known Allergies  Medications: Prior to Admission medications   Medication Sig Start Date End Date Taking? Authorizing Provider  ibuprofen (ADVIL) 600 MG tablet Take 1 tablet (600 mg total) by mouth every 6 (six) hours as needed. 05/04/20  Yes Vena Austria,  MD  misoprostol (CYTOTEC) 200 MCG tablet Place four tablets in between your gums and cheeks (two tablets on each side) as instructed OR insert four tablets vaginally, repeat every 6-hrs as needed Patient not taking: Reported on 05/12/2020 05/04/20   Vena Austria, MD  oxyCODONE-acetaminophen (PERCOCET) 5-325 MG tablet Take 1 tablet by mouth every 4 (four) hours as needed for severe pain. Patient not taking: Reported on 05/12/2020 05/04/20 05/04/21  Vena Austria, MD    Physical Exam Vitals:  Vitals:   05/12/20 1344  BP: 110/68   Patient's last menstrual period was 02/04/2020 (exact date).  General: NAD HEENT: normocephalic, anicteric Pulmonary: No increased work of breathing Genitourinary:  declines Extremities: no edema, erythema, or tenderness Neurologic: Grossly intact Psychiatric: mood appropriate, affect full  Female chaperone present for pelvic  portions of the physical exam  Declines pelvic bedside US 1.4cm endometrial stripe no evidence of GS  Assessment: 30 y.o. [id number] follow up missed abortion s/p medical management  Plan: Problem List Items Addressed This Visit   None   Visit Diagnoses    Complete abortion    -  Primary   Encounter for counseling regarding contraception         1) Missed abortion - s/p cytotec - Endometrial stripe less than 38mm and absence of gestation sac is generally indicative of completed abortion ACOG Practice Bulletin 150 May 2015 reaffirmed 2017 "Early Pregnancy Loss" -Studies have shown need for surgical intervention after mifepristone use with ultarsound showing absence of GS 1 week after administration required surgical intervention at a rate of 1.6% ACOG Practice Bulletin 143 March 2014"Medical Management of First Trimester Abortion" - Rh positive rhogam not indicated  2) Contraception - Patient opts for Nexplanon for contraception.  She understands that Nexplanon is a progesterone only therapy, and that patients often patients have irregular and unpredictable vaginal bleeding or amenorrhea. She understands that other side effects are possible related to systemic progesterone, including but not limited to, headaches, breast tenderness, nausea, and irritability. While effective at preventing pregnancy long acting reversible contraceptives do not prevent transmission of sexually transmitted diseases and use of barrier methods for this purpose was discussed.  The placement procedure for Nexplanon was reviewed with the patient in detail including risks of nerve injury, infection, bleeding and injury to other muscles or tendons. She understands that the Nexplanon implant is good for 3 years and needs to be removed at the end of that time.   She understands that Nexplanon is an extremely effective option for contraception, with failure rate of <1%. - return in the next week for placement  3) A total of 15 minutes were spent in face-to-face contact with the patient during this encounter with over half of that time devoted to counseling and coordination of care.  4) Return in about 1 week (around 05/19/2020) for Nexplanon placement.     Vena Austria, MD, [facility], [facility] 05/12/2020, 2:27 PM

## 2020-05-17 ENCOUNTER — Encounter: Payer: Self-pay | Admitting: Obstetrics and Gynecology

## 2020-05-17 ENCOUNTER — Ambulatory Visit (INDEPENDENT_AMBULATORY_CARE_PROVIDER_SITE_OTHER): Payer: Medicaid Other | Admitting: Obstetrics and Gynecology

## 2020-05-17 ENCOUNTER — Other Ambulatory Visit: Payer: Self-pay

## 2020-05-17 VITALS — BP 122/60 | Wt 150.0 lb

## 2020-05-17 DIAGNOSIS — Z30017 Encounter for initial prescription of implantable subdermal contraceptive: Secondary | ICD-10-CM

## 2020-05-17 NOTE — Telephone Encounter (Signed)
Noted. Nexplanon reserved for this patient. 

## 2020-05-17 NOTE — Progress Notes (Signed)
    GYNECOLOGY PROCEDURE NOTE  Patient is a 30 y.o. [id number] presenting for Nexplanon insertion as her desires means of contraception.  She provided informed consent, signed copy in the chart, time out was performed.  Patient's last menstrual period was 02/04/2020 (exact date).  She understands that Nexplanon is a progesterone only therapy, and that patients often patients have irregular and unpredictable vaginal bleeding or amenorrhea. She understands that other side effects are possible related to systemic progesterone, including but not limited to, headaches, breast tenderness, nausea, and irritability. While effective at preventing pregnancy long acting reversible contraceptives do not prevent transmission of sexually transmitted diseases and use of barrier methods for this purpose was discussed. The placement procedure for Nexplanon was reviewed with the patient in detail including risks of nerve injury, infection, bleeding and injury to other muscles or tendons. She understands that the Nexplanon implant is good for 3 years and needs to be removed at the end of that time.  She understands that Nexplanon is an extremely effective option for contraception, with failure rate of <1%. This information is reviewed today and all questions were answered. Informed consent was obtained, both verbally and written.   The patient is healthy and has no contraindications to Implanon use. Urine pregnancy test was performed today and was negative.  Procedure Appropriate time out taken.  Patient placed in dorsal supine with left arm above head, elbow flexed at 90 degrees, arm resting on examination table.  The bicipital grove was palpated and site 8-10cm proximal to the medial epicondyle was indentified . The insertion site was prepped with a two betadine swabs and then injected with 3 cc of 1% lidocaine without epinephrine.  Nexplanon removed form sterile blister packaging,  Device confirmed in needle, before  inserting full length of needle, tenting up the skin as the needle was advance.  The drug eluting rod was then deployed by pulling back the slider per the manufactures recommendation.  The implant was palpable by the clinician as well as the patient.  The insertion site covered dressed with a band aid before applying  a kerlex bandage pressure dressing..Minimal blood loss was noted during the procedure.  The patientt tolerated the procedure well.   She was instructed to wear the bandage for 24 hours, call with any signs of infection.  She was given the Implanon card and instructed to have the rod removed in 3 years.  Vena Austria, MD, [facility], [facility] 05/17/2020, 3:38 PM

## 2020-12-06 NOTE — Telephone Encounter (Signed)
Nexplanon rcvd/charged 05/17/20

## 2021-11-04 ENCOUNTER — Other Ambulatory Visit: Payer: Self-pay

## 2021-11-04 ENCOUNTER — Emergency Department: Payer: Medicaid Other

## 2021-11-04 ENCOUNTER — Encounter: Payer: Self-pay | Admitting: Emergency Medicine

## 2021-11-04 ENCOUNTER — Emergency Department
Admission: EM | Admit: 2021-11-04 | Discharge: 2021-11-04 | Disposition: A | Discharge status: Home / Self Care | Payer: Medicaid Other | Attending: Emergency Medicine | Admitting: Emergency Medicine

## 2021-11-04 DIAGNOSIS — R002 Palpitations: Secondary | ICD-10-CM | POA: Diagnosis not present

## 2021-11-04 DIAGNOSIS — K047 Periapical abscess without sinus: Secondary | ICD-10-CM | POA: Insufficient documentation

## 2021-11-04 DIAGNOSIS — K0889 Other specified disorders of teeth and supporting structures: Secondary | ICD-10-CM | POA: Diagnosis present

## 2021-11-04 LAB — COMPREHENSIVE METABOLIC PANEL
ALT: 19 U/L (ref 0–44)
AST: 17 U/L (ref 15–41)
Albumin: 4.3 g/dL (ref 3.5–5.0)
Alkaline Phosphatase: 81 U/L (ref 38–126)
Anion gap: 9 (ref 5–15)
BUN: 5 mg/dL — ABNORMAL LOW (ref 6–20)
CO2: 25 mmol/L (ref 22–32)
Calcium: 9 mg/dL (ref 8.9–10.3)
Chloride: 105 mmol/L (ref 98–111)
Creatinine, Ser: 0.84 mg/dL (ref 0.44–1.00)
GFR, Estimated: >60 mL/min (ref >60)
Glucose, Bld: 90 mg/dL (ref 70–99)
Potassium: 3.3 mmol/L — ABNORMAL LOW (ref 3.5–5.1)
Sodium: 139 mmol/L (ref 135–145)
Total Bilirubin: 1.4 mg/dL — ABNORMAL HIGH (ref 0.3–1.2)
Total Protein: 7.6 g/dL (ref 6.5–8.1)

## 2021-11-04 LAB — CBC WITH DIFFERENTIAL/PLATELET
Abs Immature Granulocytes: 0.01 10*3/uL (ref 0.00–0.07)
Basophils Absolute: 0 10*3/uL (ref 0.0–0.1)
Basophils Relative: 1 %
Eosinophils Absolute: 0.4 10*3/uL (ref 0.0–0.5)
Eosinophils Relative: 6 %
HCT: 39.9 % (ref 36.0–46.0)
Hemoglobin: 13.4 g/dL (ref 12.0–15.0)
Immature Granulocytes: 0 %
Lymphocytes Relative: 21 %
Lymphs Abs: 1.5 10*3/uL (ref 0.7–4.0)
MCH: 29.1 pg (ref 26.0–34.0)
MCHC: 33.6 g/dL (ref 30.0–36.0)
MCV: 86.7 fL (ref 80.0–100.0)
Monocytes Absolute: 0.8 10*3/uL (ref 0.1–1.0)
Monocytes Relative: 10 %
Neutro Abs: 4.5 10*3/uL (ref 1.7–7.7)
Neutrophils Relative %: 62 %
Platelets: 270 10*3/uL (ref 150–400)
RBC: 4.6 MIL/uL (ref 3.87–5.11)
RDW: 14.8 % (ref 11.5–15.5)
WBC: 7.2 10*3/uL (ref 4.0–10.5)
nRBC: 0 % (ref 0.0–0.2)

## 2021-11-04 LAB — ACETAMINOPHEN LEVEL: Acetaminophen (Tylenol), Serum: <10 ug/mL — ABNORMAL LOW (ref 10–30)

## 2021-11-04 LAB — TSH: TSH: 0.723 u[IU]/mL (ref 0.350–4.500)

## 2021-11-04 LAB — SALICYLATE LEVEL: Salicylate Lvl: <7 mg/dL — ABNORMAL LOW (ref 7.0–30.0)

## 2021-11-04 LAB — TROPONIN I (HIGH SENSITIVITY)
Troponin I (High Sensitivity): <2 ng/L (ref <18)
Troponin I (High Sensitivity): <2 ng/L (ref <18)

## 2021-11-04 MED ORDER — AMOXICILLIN-POT CLAVULANATE 875-125 MG PO TABS: 1.0000 | ORAL_TABLET | Freq: Two times a day (BID) | ORAL | 0 refills | Status: AC

## 2021-11-04 MED ORDER — SODIUM CHLORIDE 0.9 % IV SOLN
3.0000 g | Freq: Once | INTRAVENOUS | Status: AC
  Administered 2021-11-04: 3 g via INTRAVENOUS
  Filled 2021-11-04: qty 8

## 2021-11-04 MED ORDER — IOHEXOL 300 MG/ML  SOLN
75.0000 mL | Freq: Once | INTRAMUSCULAR | Status: AC | PRN
  Administered 2021-11-04: 75 mL via INTRAVENOUS

## 2021-11-04 MED ORDER — OXYCODONE-ACETAMINOPHEN 5-325 MG PO TABS: 1.0000 | ORAL_TABLET | ORAL | 0 refills | Status: AC | PRN

## 2021-11-04 NOTE — ED Notes (Signed)
Ear irrigated again, much more wax removed.

## 2021-11-04 NOTE — ED Triage Notes (Signed)
Right jaw dental pain.  States has been taking medication pills from neighbor, patient doesn't know what medication is, taking ibuprofen, and sleeping meds (Nyquil).  Now c/o palpitations since this morning.  States has been taking advil and ibuprofen today and Nyquil.

## 2021-11-04 NOTE — Discharge Instructions (Addendum)
You have a tooth abscess.  Please look at the list of dental clinics that I have given you or try [facility] they have a dentist on-call all the time.  You can also try their dental clinic.  Once the infection calms down you probably will need that tooth pulled.  I will give you Augmentin 1 pill twice a day with food.  If you do not take it with food there is a risk of diarrhea.  If you get bad diarrhea or fever come back here.  I will also give you some Percocet 1 pill 4 times a day as needed for pain.  Please return as I mentioned for worse pain or fever or more swelling or any other trouble.

## 2021-11-04 NOTE — ED Notes (Signed)
Ear irrigated with 259mL half warm water half peroxide, ear looks much cleaner. MD to reeval.

## 2021-11-04 NOTE — ED Provider Notes (Incomplete)
[facility] Provider Note    Event Date/Time   First MD Initiated Contact with Patient 11/04/21 1458     (approximate)   History   Palpitations   HPI  Carolyn Petersen is a 31 y.o. female who reports right-sided facial swelling gum swelling and tooth swelling pains behind the right ear going down under the chin and front of the right ear going down under the chin there are some swelling in the right cheek and tenderness in the upper right-sided posterior molar.  Patient reports she has been getting all kind of pain pills from her neighbor and taking them and now her heart is feeling funny.  This sounds like mostly palpitations.      Physical Exam   Triage Vital Signs: ED Triage Vitals  Enc Vitals Group     BP 11/04/21 1445 (!) 139/107     Pulse Rate 11/04/21 1445 84     Resp 11/04/21 1445 16     Temp 11/04/21 1445 98.6 F (37 C)     Temp Source 11/04/21 1445 Oral     SpO2 11/04/21 1445 95 %     Weight 11/04/21 1446 149 lb 14.6 oz (68 kg)     Height 11/04/21 1446 5\' 2"  (1.575 m)     Head Circumference --      Peak Flow --      Pain Score 11/04/21 1445 7     Pain Loc --      Pain Edu? --      Excl. in [place]? --     Most recent vital signs: Vitals:   11/04/21 1445 11/04/21 1913  BP: (!) 139/107 139/87  Pulse: 84 80  Resp: 16 16  Temp: 98.6 F (37 C)   SpO2: 95%      General: Awake, no distress.  Head normocephalic atraumatic except for swelling as described in HPI above Ears left TM is clear right TM is occluded by wax CV:  Good peripheral perfusion.  Heart regular rate and rhythm no audible murmurs Resp:  Normal effort.  Lungs are clear Abd:  No distention.  Soft and nontender Extremities: No edema   ED Results / Procedures / Treatments   Labs (all labs ordered are listed, but only abnormal results are displayed) Labs Reviewed  COMPREHENSIVE METABOLIC PANEL - Abnormal; Notable for the following components:      Result Value    Potassium 3.3 (*)    BUN 5 (*)    Total Bilirubin 1.4 (*)    All other components within normal limits  ACETAMINOPHEN LEVEL - Abnormal; Notable for the following components:   Acetaminophen (Tylenol), Serum <10 (*)    All other components within normal limits  SALICYLATE LEVEL - Abnormal; Notable for the following components:   Salicylate Lvl <3.2 (*)    All other components within normal limits  CBC WITH DIFFERENTIAL/PLATELET  TSH  TROPONIN I (HIGH SENSITIVITY)  TROPONIN I (HIGH SENSITIVITY)     EKG  EKG read and interpreted by me shows normal sinus rhythm rate of 71 normal axis no acute ST-T changes  RADIOLOGY CT shows a tooth abscess with some inflammation extending to the pterygoid.   PROCEDURES:  Critical Care performed:   Procedures   MEDICATIONS ORDERED IN ED: Medications  iohexol (OMNIPAQUE) 300 MG/ML solution 75 mL (75 mLs Intravenous Contrast Given 11/04/21 1733)  Ampicillin-Sulbactam (UNASYN) 3 g in sodium chloride 0.9 % 100 mL IVPB (0 g Intravenous Stopped 11/04/21 1913)  IMPRESSION / MDM / ASSESSMENT AND PLAN / ED COURSE  I reviewed the triage vital signs and the nursing notes. Patient was given Unasyn IV and Augmentin p.o. along with some oxycodone for pain.  I discussed with her the importance of returning if she worsens at all.  And also for follow-up with dental clinics.  I gave her a list of a number of clinics that can see her free for reduced cost.  Differential diagnosis includes, but is not limited to, facial cellulitis, parotitis, abscess, tooth infection with spreading infection which is what she has.  Patient's presentation is most consistent with acute complicated illness / injury requiring diagnostic workup.  No arrhythmias or PVCs were seen on the monitor while the patient was here.  Her troponins were negative.  Patient said that most of her problem was her swelling in her face and the pain she was having from the tooth and that seemed  to be making heart kind of jittery she said.  She had no pain and no palpitations here at all.  FINAL CLINICAL IMPRESSION(S) / ED DIAGNOSES   Final diagnoses:  Tooth abscess     Rx / DC Orders   ED Discharge Orders          Ordered    amoxicillin-clavulanate (AUGMENTIN) 875-125 MG tablet  2 times daily        11/04/21 1835    oxyCODONE-acetaminophen (PERCOCET) 5-325 MG tablet  Every 4 hours PRN        11/04/21 1835             Note:  This document was prepared using Dragon voice recognition software and may include unintentional dictation errors.   Arnaldo Natal, MD 11/05/21 2312    Arnaldo Natal, MD 11/05/21 [phone]

## 2022-02-13 IMAGING — US US OB < 14 WEEKS - US OB TV
1 series · 14 of 28 positions shown · non-contrast
Comparison: None.

CLINICAL DATA: Vaginal bleeding

EXAM:
OBSTETRIC <14 WK US AND TRANSVAGINAL OB US
TECHNIQUE: Both transabdominal and transvaginal ultrasound examinations were
performed for complete evaluation of the gestation as well as the
maternal uterus, adnexal regions, and pelvic cul-de-sac.
Transvaginal technique was performed to assess early pregnancy.

[Series 1: us ob less than 14 weeks with ob transvaginal · 119 acquisitions, 14 frames shown]
[im 5/119]
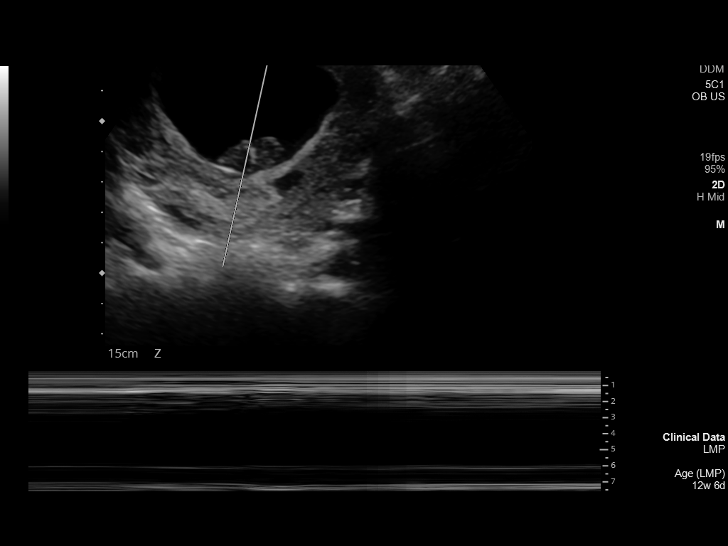
[im 14/119]
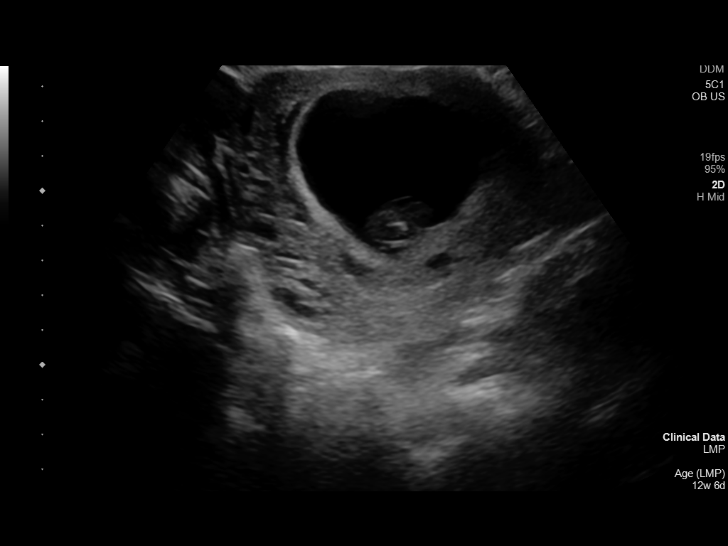
[im 22/119]
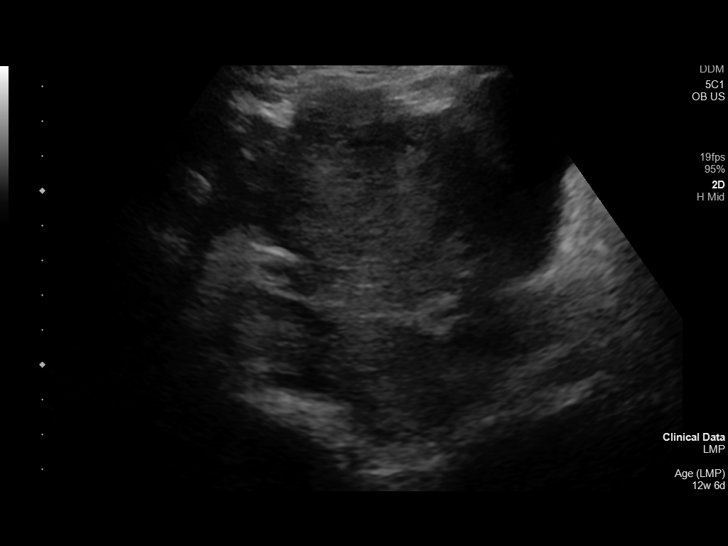
[im 31/119]
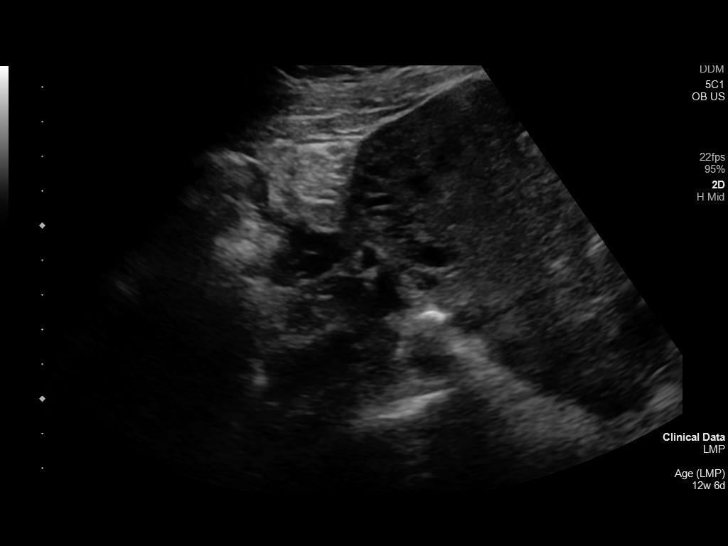
[im 40/119]
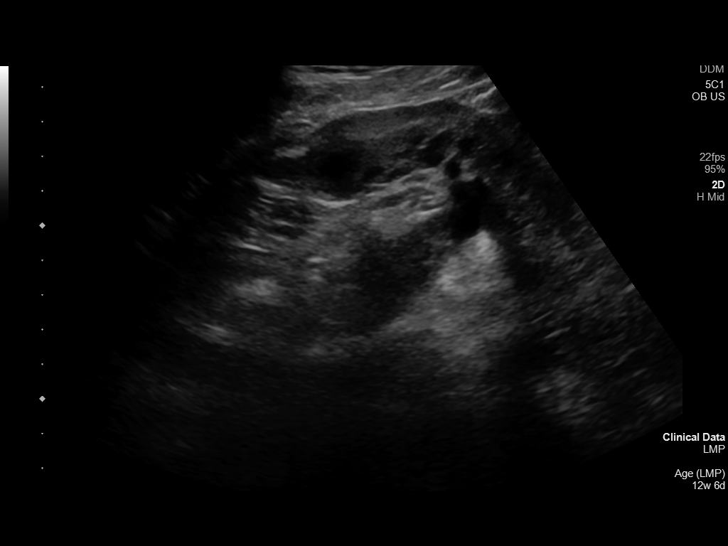
[im 49/119]
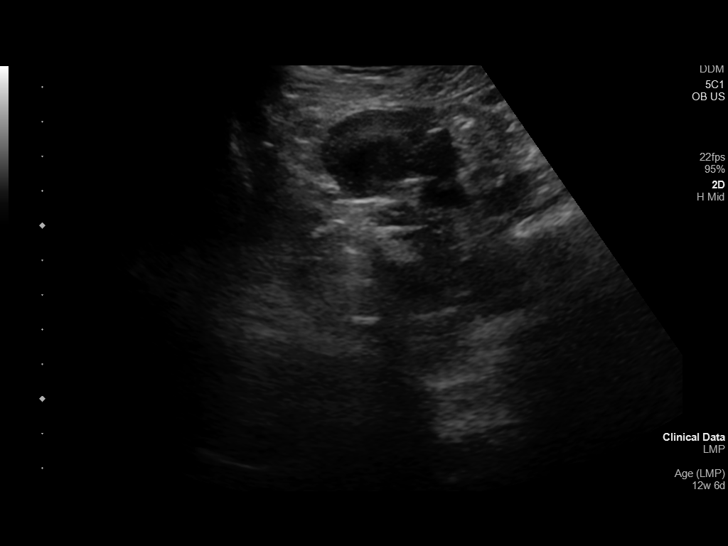
[im 57/119]
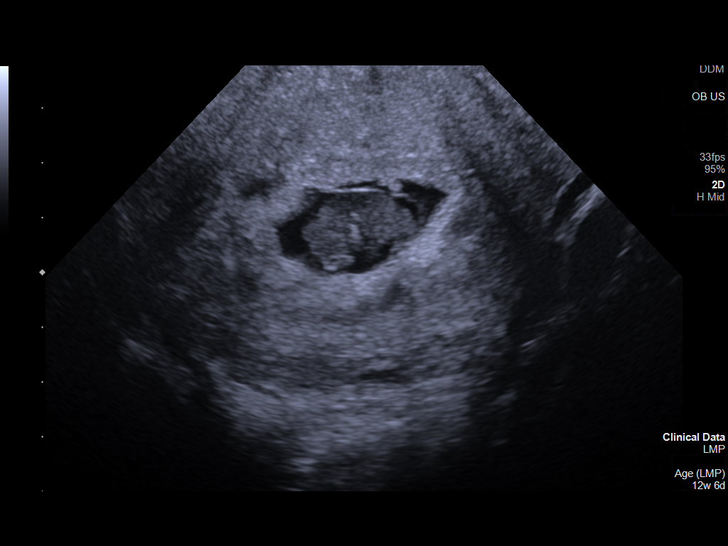
[im 66/119]
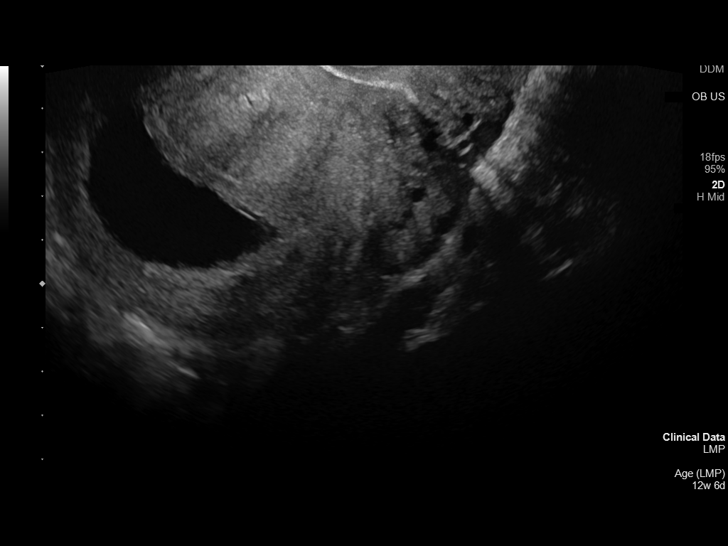
[im 75/119]
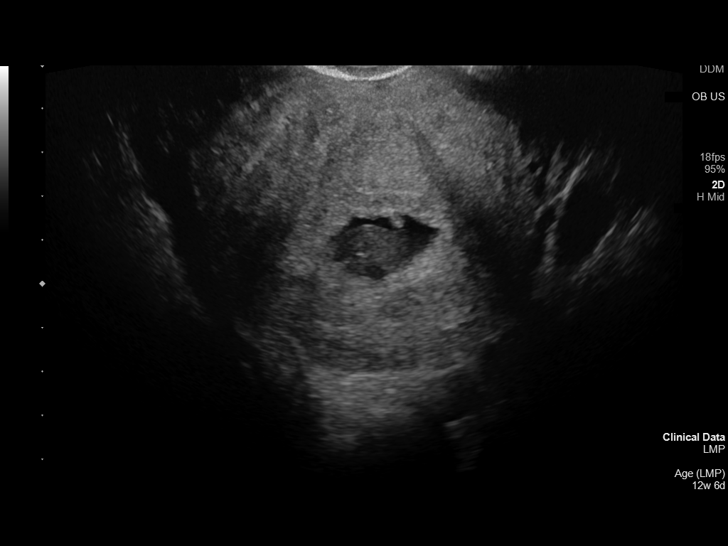
[im 84/119]
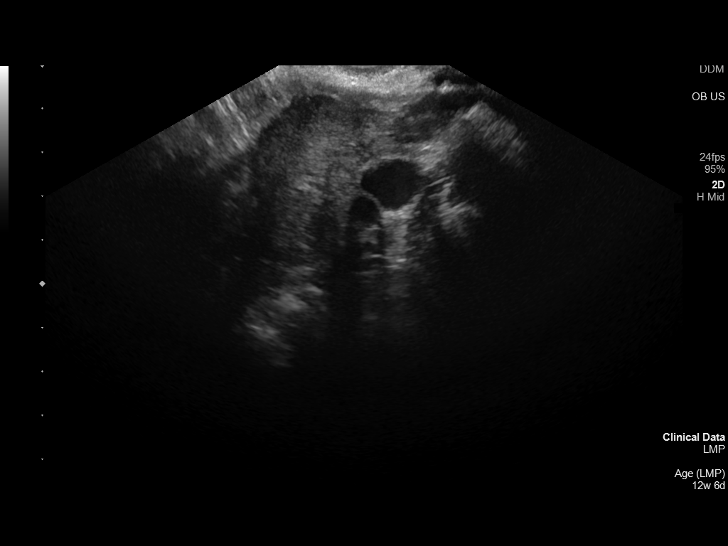
[im 92/119]
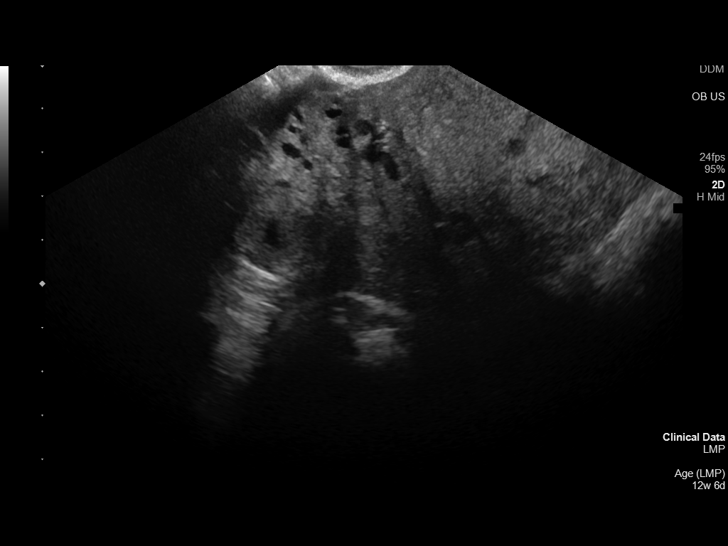
[im 101/119]
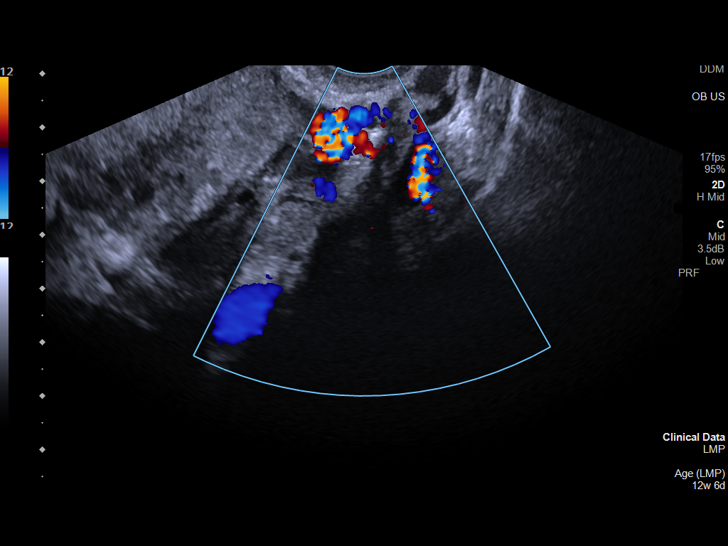
[im 110/119]
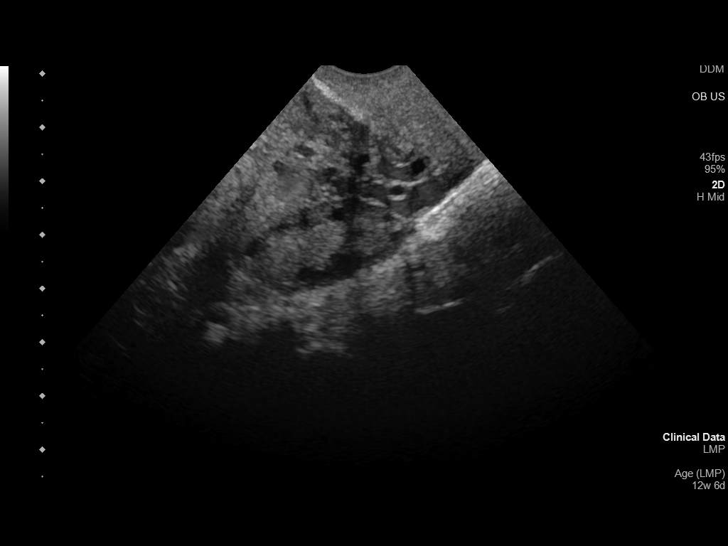
[im 119/119]
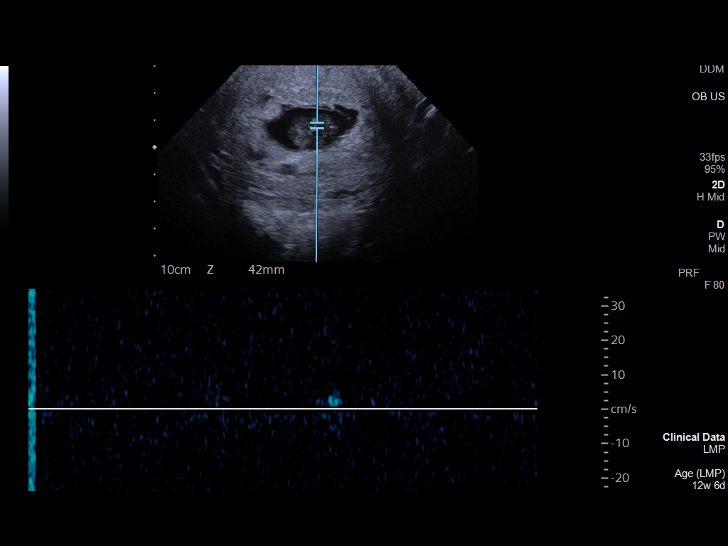

[14 of 28 positions shown; findings below may reference images not displayed]

FINDINGS: Intrauterine gestational sac: Single

Yolk sac:  Not Visualized.

Embryo:  Visualized.

Cardiac Activity: Not Visualized.

CRL:  21 mm   8 w   6 d

Subchorionic hemorrhage:  Small

Maternal uterus/adnexae: Normal appearing ovaries
IMPRESSION: Findings meet definitive criteria for failed pregnancy. This follows
SRU consensus guidelines: Diagnostic Criteria for Nonviable
Pregnancy Early in the First Trimester. N Engl J Med

## 2022-06-29 ENCOUNTER — Ambulatory Visit: Payer: Medicaid Other | Admitting: Advanced Practice Midwife

## 2022-07-04 ENCOUNTER — Ambulatory Visit: Payer: Medicaid Other | Admitting: Obstetrics and Gynecology

## 2022-07-04 NOTE — Telephone Encounter (Signed)
Tried again, no answer, LVMTRC. 

## 2023-08-27 ENCOUNTER — Ambulatory Visit

## 2024-01-25 ENCOUNTER — Ambulatory Visit: Payer: Self-pay
